# Patient Record
Sex: Female | Born: 1935 | Race: White | Hispanic: No | Smoking: Never smoker
Health system: Southern US, Community
[De-identification: ages and names within clinical notes are randomized; demographics above are authoritative.]

## PROBLEM LIST (undated history)

## (undated) DIAGNOSIS — I469 Cardiac arrest, cause unspecified: Secondary | ICD-10-CM

## (undated) DIAGNOSIS — K219 Gastro-esophageal reflux disease without esophagitis: Secondary | ICD-10-CM

## (undated) DIAGNOSIS — I82409 Acute embolism and thrombosis of unspecified deep veins of unspecified lower extremity: Secondary | ICD-10-CM

## (undated) DIAGNOSIS — I4891 Unspecified atrial fibrillation: Secondary | ICD-10-CM

## (undated) DIAGNOSIS — M1990 Unspecified osteoarthritis, unspecified site: Secondary | ICD-10-CM

## (undated) DIAGNOSIS — D649 Anemia, unspecified: Secondary | ICD-10-CM

## (undated) DIAGNOSIS — J189 Pneumonia, unspecified organism: Secondary | ICD-10-CM

## (undated) DIAGNOSIS — I8390 Asymptomatic varicose veins of unspecified lower extremity: Secondary | ICD-10-CM

## (undated) DIAGNOSIS — E7800 Pure hypercholesterolemia, unspecified: Secondary | ICD-10-CM

## (undated) DIAGNOSIS — E119 Type 2 diabetes mellitus without complications: Secondary | ICD-10-CM

## (undated) DIAGNOSIS — I10 Essential (primary) hypertension: Secondary | ICD-10-CM

## (undated) DIAGNOSIS — R011 Cardiac murmur, unspecified: Secondary | ICD-10-CM

## (undated) DIAGNOSIS — I509 Heart failure, unspecified: Secondary | ICD-10-CM

## (undated) HISTORY — DX: Asymptomatic varicose veins of unspecified lower extremity: I83.90

## (undated) HISTORY — DX: Essential (primary) hypertension: I10

## (undated) HISTORY — DX: Anemia, unspecified: D64.9

## (undated) HISTORY — PX: ABDOMINAL HYSTERECTOMY: SHX81

## (undated) HISTORY — DX: Heart failure, unspecified: I50.9

## (undated) HISTORY — DX: Unspecified atrial fibrillation: I48.91

## (undated) HISTORY — DX: Unspecified osteoarthritis, unspecified site: M19.90

## (undated) HISTORY — PX: TUBAL LIGATION: SHX77

## (undated) HISTORY — PX: APPENDECTOMY: SHX54

## (undated) HISTORY — PX: BACK SURGERY: SHX140

---

## 1998-06-16 ENCOUNTER — Encounter: Payer: Self-pay | Admitting: Cardiology

## 1998-06-16 ENCOUNTER — Inpatient Hospital Stay (HOSPITAL_COMMUNITY): Admission: AD | Admit: 1998-06-16 | Discharge: 1998-06-17 | Payer: Self-pay | Admitting: Cardiology

## 1999-05-31 ENCOUNTER — Inpatient Hospital Stay (HOSPITAL_COMMUNITY): Admission: AD | Admit: 1999-05-31 | Discharge: 1999-06-02 | Payer: Self-pay | Admitting: Neurosurgery

## 1999-06-01 ENCOUNTER — Encounter: Payer: Self-pay | Admitting: Internal Medicine

## 2001-11-19 ENCOUNTER — Inpatient Hospital Stay (HOSPITAL_COMMUNITY): Admission: EM | Admit: 2001-11-19 | Discharge: 2001-11-22 | Payer: Self-pay | Admitting: Cardiovascular Disease

## 2006-06-28 ENCOUNTER — Emergency Department (HOSPITAL_COMMUNITY): Admission: EM | Admit: 2006-06-28 | Discharge: 2006-06-28 | Payer: Self-pay | Admitting: Emergency Medicine

## 2008-11-14 HISTORY — PX: POSTERIOR LAMINECTOMY / DECOMPRESSION LUMBAR SPINE: SUR740

## 2008-11-15 ENCOUNTER — Ambulatory Visit (HOSPITAL_COMMUNITY): Admission: RE | Admit: 2008-11-15 | Discharge: 2008-11-15 | Payer: Self-pay | Admitting: Neurosurgery

## 2008-11-19 ENCOUNTER — Inpatient Hospital Stay (HOSPITAL_COMMUNITY): Admission: RE | Admit: 2008-11-19 | Discharge: 2008-11-22 | Payer: Self-pay | Admitting: Neurosurgery

## 2010-05-07 ENCOUNTER — Encounter: Payer: Self-pay | Admitting: Unknown Physician Specialty

## 2010-07-23 LAB — BASIC METABOLIC PANEL
BUN: 8 mg/dL (ref 6–23)
CO2: 25 mEq/L (ref 19–32)
Calcium: 9.3 mg/dL (ref 8.4–10.5)
Creatinine, Ser: 0.65 mg/dL (ref 0.4–1.2)
Glucose, Bld: 130 mg/dL — ABNORMAL HIGH (ref 70–99)

## 2010-07-23 LAB — CBC
Hemoglobin: 12.5 g/dL (ref 12.0–15.0)
MCHC: 34 g/dL (ref 30.0–36.0)
Platelets: 306 10*3/uL (ref 150–400)
RDW: 14.3 % (ref 11.5–15.5)

## 2010-07-23 LAB — PROTIME-INR
INR: 0.9 (ref 0.00–1.49)
Prothrombin Time: 12.1 seconds (ref 11.6–15.2)

## 2010-08-29 NOTE — Op Note (Signed)
Alexandra Hogan, Alexandra Hogan                ACCOUNT NO.:  [id number]   MEDICAL RECORD NO.:  [id number]          PATIENT TYPE:  INP   LOCATION:  3004                         FACILITY:  [facility]   PHYSICIAN:  Hilda Lias, M.D.   DATE OF BIRTH:  01/23/1929   DATE OF PROCEDURE:  11/19/2008  DATE OF DISCHARGE:                               OPERATIVE REPORT   PREOPERATIVE DIAGNOSIS:  Lower degenerative disk disease with chronic  L4, 5, S1 radiculopathy, right.   POSTOPERATIVE DIAGNOSIS:  Lower degenerative disk disease with chronic  L4, 5, S1 radiculopathy, right.   PROCEDURE:  Right L3-4, L4-5, L5-S1 laminotomy and foraminotomy,  decompression of the thecal sac and nerve root, microscope.   SURGEON:  Hilda Lias, MD   ASSISTANT:  Clydene Fake, MD   CLINICAL HISTORY:  [name] is a 77 year old female who was complaining  of back pain with radiation to the right leg.  I have seen Mr. Mcgowen for  2 years, and she came to my office with her family stating she was  worse.  Myelogram shows stenosis mostly at the neural foramen at the  level of L3-4, L4-5, and L5-S1 right worse than the left one.  The  patient denies any pain in the left leg.  The patient wants to proceed  with surgery.   PROCEDURE:  The patient was taken to the OR, and after intubation, she  was positioned in a prone manner.  The back was cleaned with DuraPrep.  Midline incision from L3 to L5-S1 was made and muscles were retracted  bilaterally.  Then with the drill and with the help of the microscope,  we did a laminotomy at L3-4, L4-5, and L5-S1.  A thick calcified  ligament was also excised.  We went ahead and we decompressed the L3-L4  with plenty of room for the L3 and L4 nerve root.  At the L4-5 and L5-S1  the area was more narrow, but at the end we had a good decompression for  the L4-L5 and L5-S1 nerve root.  X-ray showed that indeed we were in the  right area.  Having good decompression, the area was irrigated.  Valsalva maneuver was negative.  Then fentanyl and Depo-Medrol were left  in the epidural space and the wound was closed with Vicryl and Steri-  Strips.           ______________________________  Hilda Lias, M.D.     EB/MEDQ  D:  11/19/2008  T:  11/20/2008  Job:  [id number]

## 2010-08-29 NOTE — Discharge Summary (Signed)
Alexandra Hogan, Alexandra Hogan                ACCOUNT NO.:  [id number]   MEDICAL RECORD NO.:  [id number]          PATIENT TYPE:  INP   LOCATION:  3004                         FACILITY:  [facility]   PHYSICIAN:  Hilda Lias, M.D.   DATE OF BIRTH:  1928/06/01   DATE OF ADMISSION:  11/19/2008  DATE OF DISCHARGE:  11/22/2008                               DISCHARGE SUMMARY   ADMISSION DIAGNOSIS:  Degenerative disk disease with a right L4, L5, and  S1 radiculopathy.   POSTOPERATIVE DIAGNOSIS:  Degenerative disk disease with a right L4, L5,  and S1 radiculopathy.   PROCEDURE:  The patient was admitted because of back pain with radiation  down to right leg.  She has failed conservative treatment.  X-ray showed  degenerative disk disease with a stenosis, right worse than the left  one.  Surgery was advised.  Laboratory normal.   COURSE IN THE HOSPITAL:  The patient was taken to surgery on August 6,  and a right L3-L4, L4-L5, and L5-S1 laminectomy, foraminotomy was done.  Today, she is doing great.  She is walking, she wants to go home.   CONDITION ON DISCHARGE:  Improving.   MEDICATIONS:  She will continue taking her previous medication including  Vicodin for pain.   DIET:  Regular.   ACTIVITY:  Not to drive for at least 10 days.   FOLLOWUP:  She will be seen by me in my office in 4 weeks or before as  needed.           ______________________________  Hilda Lias, M.D.     EB/MEDQ  D:  11/22/2008  T:  11/22/2008  Job:  [id number]

## 2010-09-01 NOTE — Consult Note (Signed)
Alexandra Hogan, Alexandra Hogan                          ACCOUNT NO.:  [id number]   MEDICAL RECORD NO.:  [id number]                   PATIENT TYPE:  INP   LOCATION:  3731                                 FACILITY:  [facility]   PHYSICIAN:  Deanna Artis. Sharene Skeans, M.D.           DATE OF BIRTH:  Aug 13, 1928   DATE OF CONSULTATION:  DATE OF DISCHARGE:                              NEUROLOGY CONSULTATION   DATE OF BIRTH: 1929/01/12   CHIEF COMPLAINT:  Possible seizure.   HISTORY OF PRESENT ILLNESS:  Alexandra Hogan is a 77 year old woman transferred  from [facility] by Dr. Gilford Raid Harsh for a failed stress test during  which time she had a run of wide QRS tachycardia thought to represent sinus  with left bundle branch block and complained of palpations without chest  pain. The patient did not show signs of cardiac ischemia but plans were made  to have the patient evaluated at [facility] for revision of  previous ablation therapy for atrial flutter with rapid ventricular  response. The patient has had episodes of dull and heavy substernal chest  pain radiating down into her arms with associated diaphoresis and dizziness.  The pain lasts from 15 to 20 minutes at a time but this did not occur during  the hospitalization. The ablation therapy was apparently carried out in  February 2001 at [facility] but the patient had occasional episode  of fast and regular heart beat in the aftermath and was kept on Coumadin. I  was asked to see the patient because she had a syncopal episode today  followed by a brief seizure event. This happened some time between 3:00 and  3:30 p.m. Reviewing the rhythm strip around 3:10 p.m., the patient had a  heart rate of 43 with, what appears to be, a junctional rhythm. According to  witnesses, the patient was sitting in a chair and stood up to answer her  phone and talked with her phone. During that time, she became dizzy and felt  as if she was going to  faint. She sat down in the chair and appeared to be  chalky according to her companion. The staff went to get a wheelchair to  transfer her to a private room. The patient slumped back in the chair and  began shaking. When he returned, the patient was groggy, however, she had  not lost control of bowels or bladder nor had she bitten her tongue. She was  assisted into the bed which is the first think that she remembers after  talking on the  phone. The patient was evaluated by the Cardiology Service  shortly thereafter. The patient showed mild orthostatic changes and then  recovered. As a result of her episode, I was asked to see the patient to  evaluate for possible seizure disorder. The only seizure-like event that the  patient can remember is back to back seizures that occurred  in 1960 when  she was having surgery to tack up her kidney. I am not exactly sure what  took place in this operation but she was having problems with urinary tract  infections before the procedure and did not afterwards.   PAST MEDICAL HISTORY:  Unremarkable for hypertension, diabetes mellitus,  TIA's and cerebral accidents.   PAST SURGICAL HISTORY:  Remarkable for partial hysterectomy in the 1970s,  tonsillectomy, adenoidectomy, bilateral iridectomies with lens implantation,  as well as the kidney procedure.   REVIEW OF SYSTEMS:  The patient has not had headaches, fever, chills, cough,  abdominal pain. The patient says that she has had some nausea associated  with her dizzy spells but did not complain of nausea today. She has had no  vomiting or diarrhea or skin rashes. No thyroid dysfunction. The remainder  of the twelve system review was negative.   CURRENT MEDICATIONS:  Lanoxin 0.25 mg per day, Lopressor 25 mg bid, Coumadin  1.5 mg QD, Estrogen one daily, Triavil one QHS.   ALLERGIES:  No known drug allergies.   FAMILY HISTORY:  There is a very significant family history of heart disease  on her father's  side with the youngest female dying in his 31s.   SOCIAL HISTORY:  The patient has had a friend for forty one years. She  does not smoke cigarettes or drink alcohol.   PHYSICAL EXAMINATION:  GENERAL: A pleasant, well developed, well nourished  right handed woman in no acute distress.  VITAL SIGNS: Blood pressure 140/84, resting pulse 62, temperature 97.3, and  pulse oximetry 99% on 2 liters of oxygen. She is now not wearing the oxygen.  HEENT: No signs of infection.  NECK: Supple. Full range of motion. No cranial or cervical bruits.  LUNGS: Clear to auscultation and percussion.  HEART: No murmur, rub, or gallop. Pulses normal.  ABDOMEN: Soft and nontender. Bowel sounds normal.  EXTREMITIES: Well formed without edema, cyanosis, or alterations in tone or  tight heel cords.  NEURO: Mental status revealed that the patient was awake, alert,  appropriate. No dysphagia or dystaxia. She had normal fund of knowledge.  Normal recent and remote memory. Round and reactive pupils. Normal fundi.  Full visual fields and normal simultaneous stimuli. The patient has had  bilateral iridectomies. Extraocular movements are full and conjugate. Okay  end responses equal bilaterally. Symmetric facial strength and sensation.  Air conduction greater than bone conduction. The patient has no protrusion  of tongue and elevated uvula in midline. Motor exam reveals normal strength,  tone, and mass. Good fine motor movements. No pronator drift. Sensation  intact. Cerebellar exam revealed good finger-to-nose without repetitive  movements. Gait and station was normal. She could walk on her heels and  toes. She performed some tandem with difficulty. Romberg response was a it  shaky but she did not fell. Deep tendon reflexes were symmetric, normal at  the knees, and diminished elsewhere. The patient had bilateral flexor  plantar responses.  IMPRESSION:  Syncope (780.2) with a syncopal seizure (780.39). I strongly   suspect that the patient had a vaso vagal event based on the rhythm strip.  If she had had a seizure, she would have had sinus tachycardia, which was  never present during the period that she was evaluated. Nonetheless, with  the past history of back-to-back seizures, we should evaluate her seizures  with an EEG.   PLAN:  1. EEG in the morning.  2. Continue ablation therapy on November 21, 2001.  3. If episodes continue, we may need to treat the patient for     neurocardiogenic syncope.  4. CT scan is not necessary. Apparently, the patient refused the scan     previously.  5. With a normal examination, I see no reason to carry out imaging studies.   SUMMARY:  I have discussed the above with the patient and she understands.  If you have any questions about this or if I can be of assistance, do not  hesitate to contact me.                                               Deanna Artis. Sharene Skeans, M.D.   [facility]  D:  11/19/2001  T:  11/24/2001  Job:  [id number]   cc:   Noralyn Pick. Eden Emms, M.D. [facility]

## 2010-09-01 NOTE — Discharge Summary (Signed)
[name], Hogan                          ACCOUNT NO.:  [id number]   MEDICAL RECORD NO.:  [id number]                   PATIENT TYPE:  INP   LOCATION:  3731                                 FACILITY:  [facility]   PHYSICIAN:  Joellyn Rued                        DATE OF BIRTH:  03-Jan-1929   DATE OF ADMISSION:  11/19/2001  DATE OF DISCHARGE:  11/22/2001                           DISCHARGE SUMMARY - REFERRING   SUMMARY OF HISTORY:  The patient is a 77 year old white female patient of  Dr. Doreen Beam.  She was admitted to [facility] on November 17, 2001 with  intermittent episodes of dull chest discomfort with radiation down to both  arms.  This was associated with dizziness, diaphoresis and palpitations.  Her discomfort lasted 15-20 minutes at a time and then would resolve and not  necessarily related to activity.  While in the hospital she had recurring  chest discomfort and a stress Cardiolite showed an EF of 67% and an exercise  induced left bundle branch block.  She is status post A flutter ablation by  Dr. Ladona Ridgel and Dr. Phoebe Perch wanted her transferred for cardiac catheterization  to rule out coronary artery disease.  Her INR on August 6th was 2.4 thus her  Coumadin was held and she was scheduled for cardiac catheterization when her  INR is less than 1.5.   PAST MEDICAL HISTORY:  Her history is also notable for tonsillectomy,  partial hysterectomy, BLA, cataract removal, and dizziness.   LABORATORY DATA:  Here at [facility] on admission H&H is 11.7 and  34.8, normal indices, platelets 288, WBC 10.3.  PT on admission was 23.2,  INR 2.4.  On the 7th it was 22.2 and INR 2.2.  Sodium was 137, potassium  4.2, BUN 12, creatinine 0.8, glucose 120, calcium 8.7.  Subsequent  hematologies were essentially unremarkable.  By the 8th her INR was down to  1.5.  Chest x-ray from [place] did not show any acute abnormality.  EKG  showed normal sinus rhythm, delayed R wave, left axis  deviation, left  anterior hemiblock, no acute changes.  Nuclear imaging from [facility] showed an EF of 65%, no scar or ischemia.   HOSPITAL COURSE:  The patient was admitted to 3700 in anticipation of  cardiac catheterization when her INR decreased.  Shortly after admission  were called to evaluate the patient for possible syncope.  The patient  stated while sitting in a chair she stood up to answer the phone and while  standing and talking on the phone she became dizzy and felt like she was  going to faint.  She sat back down in a chair and a friend said she looked  chalky.  The staff was anticipating transferring her to a private room and  went to get a wheelchair, however, the patient had never told her she  was  dizzy.  The friend stated after the staff member left that she fell back  into the chair and began shaking.  He left the room to call the RN and when  he returned she was groggy but not shaking any more.  During this episode  she did not have any bowel movement or urinary incontinence, and the patient  stated that she felt fine.  Telemetry review did not show any abnormalities.  She describes a history of orthostatic dizziness which usually resolves in  a few minutes.  She is unsure of the frequency.  Orthostatics were checked  and were not overwhelmingly significant.   She was discussed with Dr. Eden Emms.  It was felt a CT was in order, however,  patient refused because of claustrophobia.  Dr. Eden Emms also ordered a neuro  consult.  This was obtained on 02/30/03 by Dr. Sharene Skeans.  According to his  progress note, her neuro exam was unremarkable and felt that this may be  vasovagal syncopal seizure episode.  He was concerned about possible  telemetry abnormalities but again we reviewed this and there was no  significant event.  She did not have any further episodes while in the  hospital.   On 08/07 her INR was still elevated at 2.2 thus she received 1 mg of vitamin  K.   By 08/08 her INR was down to 1.5 and Dr. Eden Emms performed a cardiac  catheterization.  This showed normal coronaries with a normal LV function  with an EF of 65%.  LV pressure was 153/15, aortic 153/74.  An EEG was  performed by neurology and this was negative for any seizure activity.  _________  on bed rest.  She was ambulating without difficulty and by 08/09  it was felt that she could be discharged home from a cardiac standpoint.  The patient was very concerned about these spells that she is having  because she did not know the etiology.  It was explained to her that we had  not discovered any cardiac etiology or neurology etiology for these episodes  that she is having and that further workup could be done as an outpatient.  Also prior to discharge she stated that she needed a copy of the  catheterization report to submit to her insurance company.  It was explained  to her that the official report has not been dictated yet, that she can  receive a copy from Dr. Phoebe Perch when it becomes available.  However, we did  give her a copy of the progress notes written by Dr. Eden Emms in regards to  her cardiac catheterization.   DISCHARGE DIAGNOSIS:  Noncardiac chest discomfort and near syncope, unknown  etiology.  History as previously.   DISPOSITION:  She was asked to continue on her home medications.  There is a  discrepancy between the recorded medications on her history and physical and  supposedly what she takes at home, however, she does not have a current list  of her medications at home.  She was asked to continue these medications.  She believes she is taking Lanoxin 0.25 mg q.d., Lopressor 25 mg b.i.d.,  Coumadin 1 mg q.d., estrogen 1.25 mg q.d., Triavil 2/25 mg q.h.s., and  Lipitor 20 mg q.d.  She is advised no lifting, driving, sexual activity or  heavy exertion for two days.  Maintain a low salt, fat and cholesterol diet. If she has any problems with the cath site she was asked to  call Dr. Phoebe Perch.  She will have a PT INR drawn in one week with Dr. Phoebe Perch and she was asked  to call him on Monday to arrange a followup appointment.  Dr. Eden Emms noted  she be continued on Coumadin long term.  The patient has several questions  as to why she is taking all these medications and it was explained to her  that Dr. Phoebe Perch and her medical doctor, Dr. Yetta Flock, that she should could  bring these issues up to them for possible adjustments.                                               Joellyn Rued    EW/MEDQ  D:  11/22/2001  T:  11/26/2001  Job:  [id number]   cc:   Clydene Fake, M.D.   Dr. Yetta Flock

## 2010-09-01 NOTE — Op Note (Signed)
[place]  Patient:    Alexandra Hogan, Alexandra Hogan                       MRN: [id number] Proc. Date: 06/01/99 Adm. Date:  [id number] Attending:  Nathen May CC:         Florence Canner, M.D., [place], [place]             Karlton Lemon, [facility]                           Operative Report  PROCEDURE PERFORMED:  Electrophysiology study and RF catheter ablation of atrial flutter.  REFERRING PHYSICIAN:  Florence Canner, M.D.; [place], [place].  INTRODUCTION:  The patient is a 77 year old woman with a history of recurrent syncopal episodes in the past.  She had a recent syncopal episode several days go, and the paramedics were called.  She was found to be in atrial flutter with a rapid ventricular rate.  She was admitted to the hospital where she was begun on Sotalol and her atrial flutter had previously spontaneously terminated.  Unfortunately he became bradycardic on Sotalol and rather than proceed with pacemaker implantation she was referred for electrophysiology study and catheter ablation.  DESCRIPTION OF PROCEDURE:  After informed consent was obtained, the patient was  taken to the diagnostic EP lab in the fasting state.  After usual preparation and draping, intravenous fentanyl and midazolam were given for conscious sedation. A 39f hexapolar catheter was inserted percutaneously into the right jugular vein and advanced to the coronary sinus.  A 5-French quadripolar catheter was inserted percutaneously into the right femoral vein and advanced to the His bundle region. A 7-French 20-pole halo catheter was inserted percutaneously into the right femoral vein and advanced to the right atrium.  Rapid atrial pacing and programmed atrial stimulation were then carried out from the coronary sinus.  This resulted in an AV Wenckebach cycle length of 320 ms. The ERP of the AV node was 500/200.  Following this, rapid atrial  pacing was carried out from the coronary sinus with a pacing cycle length of 290 ms and stepwise increased down to 200 ms.  The patient spontaneously developed typical atrial flutter, which would terminate again spontaneously.  Rapid atrial pacing was then carried out from the high right atrium.  This resulted in the initiation of typical counterclockwise atrial flutter.  Mapping of the atrial flutter demonstrated the atrial activation to occur in a counterclockwise fashion around the tricuspid valve annulus, and the coronary sinus activation was proximal to distal.  Pacing from the atrial flutter isthmus was then carried from the ablation catheter, and this demonstrated classic concealed entrainment at an atrial pacing rate of 210 ms.  Following this, the ablation catheter was moved to the tricuspid valve annulus, and RF energy application was delivered between the tricuspid valve annulus and the  station ridge.  The patient, on the first RF energy application, had spontaneous termination of his atrial flutter and restoration of sinus rhythm.  Rapid atrial pacing from the coronary sinus was then carried out, which demonstrated persistent isthmus conduction.  A total of three RF energy applications were then delivered, which resulted in the creation of block in the atrial flutter isthmus when pacing from the coronary sinus.  Six additional bonus RF energy applications were delivered, for a total  of 10 RF energy applications.  The patient was then observed for 30 minutes following the creation of block in the atrial flutter isthmus.  At this point, no additional evidence of atrial flutter isthmus conduction. The catheters were removed.  Hemostasis was assured.  The patient was returned to her room in good condition.  COMPLICATIONS:  None.  RESULTS: 1. Baseline Introitus View:  The baseline introitus view demonstrates normal sinus    rhythm, with the sinus node cycle  length of 823 ms.  The PR interval was 190 ms.    The QRS duration was 75 ms.  The AH interval had a 106 ms.  The HV interval as    69 ms. 2. Rapid Atrial Pacing:  Demonstrated an AV Wenckebach cycle length of 320 ms. 3. Programmed Atrial Stimulation:  Demonstrated AV node ERP of 500/210.  During    programmed atrial stimulation there was no AH jump and no evidence of dual AV    node physiology. 4. Arrhythmia is observed. 5. Atrial Flutter Initiation:  Rapid atrial pacing from the high right atrium.  Duration is the same.  Termination RF energy application. 6. Mapping:  Mapping of the patients atrial flutter demonstrated typical    counterclockwise tricuspid annular reentry. 7. RF Energy Application:  A total of 10 RF energy applications were delivered o    the atrial flutter isthmus.  This resulted in termination of atrial    flutter, restoration of sinus rhythm, and creation of bidirectional block    in the atrial flutter isthmus.  CONCLUSIONS:  This demonstrates successful RF catheter ablation of typical, inducible atrial flutter; without any immediate procedure complications.DD: 06/01/99 TD:  06/01/99 Job: [id number]

## 2010-09-01 NOTE — Cardiovascular Report (Signed)
   Alexandra Hogan, Alexandra Hogan                          ACCOUNT NO.:  [id number]   MEDICAL RECORD NO.:  [id number]                   PATIENT TYPE:  INP   LOCATION:  3731                                 FACILITY:  [facility]   PHYSICIAN:  Noralyn Pick. Eden Emms, M.D. [facility]           DATE OF BIRTH:  31-Jul-1928   DATE OF PROCEDURE:  DATE OF DISCHARGE:  11/22/2001                              CARDIAC CATHETERIZATION   INDICATION:  Recurrent chest pain with presyncope. The patient was  transferred to [facility] 48 hours ago. We had to wait for her INR  to drop below 2 to proceed with her catheterization. She has had a previous  flutter ablation, which is the reason she was on Coumadin.   DESCRIPTION OF PROCEDURE:  Standard catheterization was done from the right  coronary artery.   RESULTS:  Left main coronary artery was normal.   Left anterior descending artery was normal.   The circumflex coronary artery was normal.   The right coronary artery was small and normal.  There was some catheter-tip  spasm.   RIGHT ANTERIOR OBLIQUE VENTRICULOGRAPHY:  RAO ventriculography revealed  hypodynamic LV function. Ejection fraction was in excess of 80%.  There was  gradient across the aortic valve and no MR.  Aortic pressure was 158/72, LV  pressure is 158/15.   IMPRESSION/PLAN:  The patient's chest pain is noncardiac in etiology. She  will be discharged in the morning so long as her groin heals well. I think  she can be recoumadinized without heparin overlap. Her flutter ablation was  two or three years ago and she has been maintaining sinus rhythm and does  not have other indications for Coumadin. The patient will follow up in  [place] with her primary MD and Dr. Sylvie Farrier.                                                       Noralyn Pick. Eden Emms, M.D. [facility]    PCN/MEDQ  D:  11/21/2001  T:  11/25/2001  Job:  [id number]   cc:   Gilford Raid Harsh

## 2010-09-01 NOTE — H&P (Signed)
Alexandra Hogan, PAYER                          ACCOUNT NO.:  [id number]   MEDICAL RECORD NO.:  [id number]                   PATIENT TYPE:  INP   LOCATION:  3731                                 FACILITY:  [facility]   PHYSICIAN:  Noralyn Pick. Eden Emms, M.D. [facility]           DATE OF BIRTH:  02-Dec-1928   DATE OF ADMISSION:  11/19/2001  DATE OF DISCHARGE:                                HISTORY & PHYSICAL   HISTORY OF PRESENT ILLNESS:  The patient is a 77 year old patient of Dr.  Gilford Raid Harsh.  She was admitted to [facility] on November 17, 2001.  She  was admitted with intermittent episodes of dull substernal chest discomfort  with radiation down both arms.  This was associated with some dizziness and  diaphoresis and palpitations.  Pain lasted 15 to 20 minutes at a time and  then resolved.  It was not necessarily related to activity.  She had  recurrent pain while in the hospital.  The patient had an exercise stress  test by Dr. Sylvie Farrier.  The Cardiolite study was normal with EF of 67%, where  the patient developed an exercise-induced left bundle branch block.  She is  status post previous flutter ablation by Dr. Doylene Canning. Ladona Ridgel and was on  digoxin at 0.25 mg at the time.  Dr. Sylvie Farrier wanted her transferred to [facility] for heart catheterization to definitively rule out coronary  disease.  Her INR today on November 19, 2001 was 2.4 and her catheterization  will be scheduled for Friday.   PAST MEDICAL HISTORY:  The patient's past medical history is remarkable for  the history of a flutter ablation which was I believe was done in February  of 2001.  She has been on Coumadin since this time.  She does not have  hypertension, diabetes, previous TIA or CVA.  She had a hysterectomy in the  1970s.  She is a nonsmoker and nondrinker.   FAMILY HISTORY:  Several males on her father's side have died of heart  disease, the youngest being in their 23s.   ALLERGIES:  She has no known drug allergies.   CURRENT  MEDICATIONS:  1. Lanoxin 0.25 mg a day.  2. Lopressor 25 mg b.i.d.  3. Coumadin 1.5 mg a day.  4. Estrogen 1.25 mg a day.  5. Triavil 2/25 mg q.h.s.   REVIEW OF SYSTEMS:  She denies any headaches, seizure disorder, fever,  chills, cough, sputum production, shortness of breath, recent abdominal  pain, change in bowel habits or skin rashes.   PHYSICAL EXAMINATION:  GENERAL:  She is in no distress.  VITAL SIGNS:  The blood pressure is 130/70.  Pulse is 70 and regular.  LUNGS:  Clear.  NECK:  Carotids normal.  CARDIAC:  There is an S1 and S2 without murmur, rub, gallop or click.  ABDOMEN:  Benign.  _________.  EXTREMITIES:  Intact pulses.  No edema.  LABORATORY AND ACCESSORY CLINICAL DATA:  Her EKG shows no acute changes.   Her stress test did show exercise-induced left bundle branch block.   Her INR is 2.4.  Her creatinine is 0.8.  Her enzymes are negative x3.   IMPRESSION:  I am not sure if the patient will have any coronary artery  disease.   However, she has recurrent chest pain and an exercise-induced left bundle  branch block.  I suspect her left bundle branch block is secondary to some  conduction disease caused by the ablation, in addition to her Lanoxin and  beta blocker.  The patient's INR is 2.4.  We will hold her Coumadin and  start heparin and when her INR drops below 2, she be catheterized on Friday.  The risks and benefits of the procedure were discussed with the patient and  she is willing to proceed.                                               Noralyn Pick. Eden Emms, M.D. [facility]    PCN/MEDQ  D:  11/19/2001  T:  11/19/2001  Job:  [id number]

## 2012-02-18 ENCOUNTER — Other Ambulatory Visit: Payer: Self-pay | Admitting: Neurosurgery

## 2012-02-18 DIAGNOSIS — M5410 Radiculopathy, site unspecified: Secondary | ICD-10-CM

## 2012-02-18 DIAGNOSIS — M549 Dorsalgia, unspecified: Secondary | ICD-10-CM

## 2012-03-05 ENCOUNTER — Ambulatory Visit
Admission: RE | Admit: 2012-03-05 | Discharge: 2012-03-05 | Disposition: A | Discharge status: Home / Self Care | Payer: Medicare Other | Source: Ambulatory Visit | Attending: Neurosurgery | Admitting: Neurosurgery

## 2012-03-05 VITALS — BP 175/66 | HR 61 | Ht 61.0 in | Wt 147.0 lb

## 2012-03-05 DIAGNOSIS — M5410 Radiculopathy, site unspecified: Secondary | ICD-10-CM

## 2012-03-05 DIAGNOSIS — M549 Dorsalgia, unspecified: Secondary | ICD-10-CM

## 2012-03-05 MED ORDER — DIAZEPAM 5 MG PO TABS
5.0000 mg | ORAL_TABLET | Freq: Once | ORAL | Status: AC
  Administered 2012-03-05: 5 mg via ORAL

## 2012-03-05 MED ORDER — IOHEXOL 180 MG/ML  SOLN
15.0000 mL | Freq: Once | INTRAMUSCULAR | Status: AC | PRN
  Administered 2012-03-05: 15 mL via INTRATHECAL

## 2012-03-05 MED ORDER — OXYCODONE-ACETAMINOPHEN 5-325 MG PO TABS
2.0000 | ORAL_TABLET | Freq: Once | ORAL | Status: AC
  Administered 2012-03-05: 2 via ORAL

## 2012-03-05 NOTE — Progress Notes (Signed)
Pt states she has been off of trazadone and lexapro for the past 2 days.  jkl

## 2012-03-05 NOTE — Progress Notes (Signed)
Alexandra Hogan at bedside of patient in nursing station after myelogram.  Patient medicated for pain; tolerating PO's well.  jkl

## 2012-04-02 ENCOUNTER — Other Ambulatory Visit: Payer: Self-pay | Admitting: Neurosurgery

## 2012-04-02 DIAGNOSIS — M5410 Radiculopathy, site unspecified: Secondary | ICD-10-CM

## 2012-04-02 DIAGNOSIS — M549 Dorsalgia, unspecified: Secondary | ICD-10-CM

## 2012-04-07 ENCOUNTER — Ambulatory Visit
Admission: RE | Admit: 2012-04-07 | Discharge: 2012-04-07 | Disposition: A | Discharge status: Home / Self Care | Payer: Medicare Other | Source: Ambulatory Visit | Attending: Neurosurgery | Admitting: Neurosurgery

## 2012-04-07 ENCOUNTER — Other Ambulatory Visit: Payer: Self-pay | Admitting: Neurosurgery

## 2012-04-07 VITALS — BP 157/77 | HR 64

## 2012-04-07 DIAGNOSIS — M5410 Radiculopathy, site unspecified: Secondary | ICD-10-CM

## 2012-04-07 DIAGNOSIS — M549 Dorsalgia, unspecified: Secondary | ICD-10-CM

## 2012-04-07 MED ORDER — IOHEXOL 180 MG/ML  SOLN
4.0000 mL | Freq: Once | INTRAMUSCULAR | Status: AC | PRN
  Administered 2012-04-07: 4 mL via INTRA_ARTICULAR

## 2012-04-07 MED ORDER — METHYLPREDNISOLONE ACETATE 40 MG/ML INJ SUSP (RADIOLOG
120.0000 mg | Freq: Once | INTRAMUSCULAR | Status: AC
  Administered 2012-04-07: 120 mg via INTRA_ARTICULAR

## 2012-07-28 ENCOUNTER — Other Ambulatory Visit: Payer: Self-pay | Admitting: *Deleted

## 2012-07-28 DIAGNOSIS — I83893 Varicose veins of bilateral lower extremities with other complications: Secondary | ICD-10-CM

## 2012-09-26 ENCOUNTER — Encounter: Payer: Medicare Other | Admitting: Vascular Surgery

## 2012-11-20 ENCOUNTER — Encounter: Payer: Self-pay | Admitting: Vascular Surgery

## 2012-11-21 ENCOUNTER — Ambulatory Visit (INDEPENDENT_AMBULATORY_CARE_PROVIDER_SITE_OTHER): Payer: Medicare Other | Admitting: Vascular Surgery

## 2012-11-21 ENCOUNTER — Encounter: Payer: Self-pay | Admitting: Vascular Surgery

## 2012-11-21 ENCOUNTER — Encounter (INDEPENDENT_AMBULATORY_CARE_PROVIDER_SITE_OTHER): Payer: Medicare Other | Admitting: Vascular Surgery

## 2012-11-21 VITALS — BP 177/67 | HR 56 | Ht 61.0 in | Wt 154.0 lb

## 2012-11-21 DIAGNOSIS — R609 Edema, unspecified: Secondary | ICD-10-CM

## 2012-11-21 DIAGNOSIS — I83893 Varicose veins of bilateral lower extremities with other complications: Secondary | ICD-10-CM | POA: Insufficient documentation

## 2012-11-21 DIAGNOSIS — I872 Venous insufficiency (chronic) (peripheral): Secondary | ICD-10-CM | POA: Insufficient documentation

## 2012-11-21 NOTE — Progress Notes (Signed)
[facility] [place]  Referred by:  Philemon Kingdom, MD [address] [place], [place] [address]  Reason for referral: Swollen B leg  History of Present Illness  Alexandra Hogan is a 79 y.o. (03-20-1929) female who presents with chief complaint: swollen B leg.  Patient notes, onset of swelling years ago, associated with no trigger.  The patient's symptoms include: swelling in both legs with standing, bursting sensation, and pain..  The patient has had no history of DVT, known history of pregnancy, known history of varicose vein, no history of venous stasis ulcers, no history of  Lymphedema and known history of skin changes in lower legs.  There is no family history of venous disorders.  The patient has used knee high compression stockings in the past.  Past Medical History  Diagnosis Date  . Atrial fibrillation   . CHF (congestive heart failure)   . Diabetes mellitus without complication     borderline  . Hypertension   . Arthritis   . Anemia     Past Surgical History  Procedure Laterality Date  . Back surgery  2011  . Abdominal hysterectomy    . Tubal ligation    . Appendectomy      History   Social History  . Marital Status: Divorced    Spouse Name: N/A    Number of Children: N/A  . Years of Education: N/A   Occupational History  . Not on file.   Social History Main Topics  . Smoking status: Never Smoker   . Smokeless tobacco: Never Used  . Alcohol Use: No  . Drug Use: No  . Sexually Active: Not on file   Other Topics Concern  . Not on file   Social History Narrative  . No narrative on file    Family History  Problem Relation Age of Onset  . Heart disease Mother   . Heart disease Father   . Cancer Brother    MEDS aspirin 81 MG tablet 81 mg, Daily  azelastine (ASTELIN) 137 MCG/SPRAY nasal spray 1 spray, As needed  Cholecalciferol (VITAMIN D3) 1000 UNITS CAPS 1 capsule, Daily  clotrimazole-betamethasone (LOTRISONE) lotion 1 application,  2 times daily  diazepam (VALIUM) 2 MG tablet 2 mg, Every 6 hours PRN  dicyclomine (BENTYL) 10 MG/5ML syrup 20 mg, 4 times daily PRN  escitalopram (LEXAPRO) 20 MG tablet 20 mg, Daily  esomeprazole (NEXIUM) 40 MG capsule 40 mg, Daily before breakfast estrogens, conjugated, (PREMARIN) 0.45 MG tablet 0.45 mg, Daily ferrous gluconate (FERGON) 325 MG tablet 325 mg, Daily with breakfast fluticasone (FLONASE) 50 MCG/ACT nasal spray 2 spray, Daily iohexol (OMNIPAQUE) 180 MG/ML injection 4 mL 4 mL, Once PRN isosorbide dinitrate (ISORDIL) 30 MG tablet 30 mg, Daily losartan (COZAAR) 25 MG tablet 25 mg, Daily methylPREDNISolone acetate (DEPO-MEDROL) injection (RADIOLOGY ONLY) 120 mg 120 mg, Once ondansetron (ZOFRAN) 8 MG tablet 8 mg, Every 8 hours PRN oxyCODONE (OXYCONTIN) 10 MG 12 hr tablet 10 mg, Every 6 hours PRN oxyCODONE-acetaminophen (PERCOCET) 7.5-325 MG per tablet 1 tablet, Every 6 hours PRN predniSONE (DELTASONE) 1 MG tablet 1 mg, Daily promethazine (PHENERGAN) 12.5 MG suppository 12.5 mg, Every 4 hours PRN spironolactone (ALDACTONE) 25 MG tablet 25 mg, Daily torsemide (DEMADEX) 100 MG tablet 100 mg, Daily traZODone (DESYREL) 50 MG tablet 50 mg, Daily at bedtime triamcinolone cream (KENALOG) 0.1 % 1 application, 2 times daily warfarin (COUMADIN) 1 MG tablet   Allergies  Allergen Reactions  . Codeine     Pt  states she is not real sure of the reaction but knows "it did not set well with her"   REVIEW OF SYSTEMS:  (Positives checked otherwise negative)  CARDIOVASCULAR:  []  chest pain, []  chest pressure, []  palpitations, []  shortness of breath when laying flat, [x]  shortness of breath with exertion,  [x]  pain in feet when walking, [x]  pain in feet when laying flat, []  history of blood clot in veins (DVT), []  history of phlebitis, [x]  swelling in legs, []  varicose veins  PULMONARY:  []  productive cough, []  asthma, []  wheezing  NEUROLOGIC:  []  weakness in arms or legs, []  numbness in arms or  legs, []  difficulty speaking or slurred speech, []  temporary loss of vision in one eye, []  dizziness  HEMATOLOGIC:  []  bleeding problems, []  problems with blood clotting too easily  MUSCULOSKEL:  []  joint pain, []  joint swelling  GASTROINTEST:  []  vomiting blood, []  blood in stool     GENITOURINARY:  []  burning with urination, []  blood in urine  PSYCHIATRIC:  []  history of major depression  INTEGUMENTARY:  []  rashes, []  ulcers  CONSTITUTIONAL:  []  fever, []  chills  Physical Examination Filed Vitals:   11/21/12 1458  BP: 177/67  Pulse: 56  Height: 5\' 1"  (1.549 m)  Weight: 154 lb (69.854 kg)  SpO2: 98%   Body mass index is 29.11 kg/(m^2).  General: A&O x 3, WDWN, obese  Head: [place]/AT  Ear/Nose/Throat: Hearing grossly intact, nares w/o erythema or drainage, oropharynx w/o Erythema/Exudate  Eyes: PERRLA, EOMI, Post surg chg to pupils   Neck: Supple, no nuchal rigidity, no palpable LAD  Pulmonary: Sym exp, good air movt, CTAB, no rales, rhonchi, & wheezing  Cardiac: RRR, Nl S1, S2, no Murmurs, rubs or gallops  Vascular: Vessel Right Left  Radial Palpable Palpable  Brachial  Palpable  Palpable  Carotid Palpable, without bruit Palpable, without bruit  Aorta Not palpable N/A  Femoral Palpable Palpable  Popliteal Not palpable Not palpable  PT Faintly Palpable Faintly Palpable  DP Not Palpable Not Palpable   Gastrointestinal: soft, NTND, -G/R, - HSM, - masses, - CVAT B  Musculoskeletal: M/S 5/5 throughout , Extremities without ischemic changes , BLE 2+ edema, varicosities B, spider veins B  Neurologic: CN 2-12 intact , Pain and light touch intact in extremities , Motor exam as listed above  Psychiatric: Judgment intact, Mood & affect appropriate for pt's clinical situation  Dermatologic: See M/S exam for extremity exam, no rashes otherwise noted  Lymph : No Cervical, Axillary, or Inguinal lymphadenopathy   Non-Invasive Vascular Imaging  BLE Venous Insufficiency  Duplex (Date: 11/21/2012):   RLE: no DVT and SVT, + SSV & GSV reflux, + deep venous reflux: CFV  LLE: no DVT and SVT, - GSV reflux, - deep venous reflux  Outside Studies/Documentation 5 pages of outside documents were reviewed including: outpatient clinic chart.  Medical Decision Making  Alexandra Hogan is a 79 y.o. female who presents with: RLE chronic venous insufficiency (C4), LLE CVI (C2)   Based on the patient's history and examination, I recommend: B compressive therapy.  I am surprised that the L leg demonstrates no significant reflux given that she has more swelling in that leg today and varicosities are evident.  There are no signs of lymphedema in that L but that might account for some swelling in that leg  I discussed with the patient the use of her 20-30 mm thigh high compression stockings and need for 3 month trial of such.  The  patient will follow up in 3 months with my partners in the [facility] for evaluation for: EVLA R GSV.  Thank you for allowing [place] to participate in this patient's care.  Leonides Sake, MD [facility] [place] Office: [phone] Pager: [phone]  11/21/2012, 3:36 PM

## 2013-02-27 ENCOUNTER — Ambulatory Visit: Payer: Medicare Other | Admitting: Vascular Surgery

## 2013-04-02 ENCOUNTER — Ambulatory Visit (INDEPENDENT_AMBULATORY_CARE_PROVIDER_SITE_OTHER): Payer: Medicare Other

## 2013-04-02 VITALS — BP 160/70 | HR 74 | Resp 18

## 2013-04-02 DIAGNOSIS — E1149 Type 2 diabetes mellitus with other diabetic neurological complication: Secondary | ICD-10-CM

## 2013-04-02 DIAGNOSIS — L608 Other nail disorders: Secondary | ICD-10-CM

## 2013-04-02 DIAGNOSIS — Q828 Other specified congenital malformations of skin: Secondary | ICD-10-CM

## 2013-04-02 DIAGNOSIS — E1140 Type 2 diabetes mellitus with diabetic neuropathy, unspecified: Secondary | ICD-10-CM

## 2013-04-02 DIAGNOSIS — E1142 Type 2 diabetes mellitus with diabetic polyneuropathy: Secondary | ICD-10-CM

## 2013-04-02 NOTE — Progress Notes (Signed)
   Subjective:    Patient ID: Alexandra Hogan, female    DOB: [birth year], 79 y.o.   MRN: [id number]  HPI trim my nails and trim calluses and I just had a pacemaker put in on Monday 03/30/13 Patient had a pacemaker placed on Monday of this week. She is doing much better with her heart although still somewhat weak. Continues to have keratoses sub-fifth MP. Bilateral as well as nails and debridement.   Review of Systems  Constitutional: Negative.   HENT: Negative.   Cardiovascular: Positive for palpitations and leg swelling.  Genitourinary: Negative.   Allergic/Immunologic: Negative.   Neurological: Positive for numbness.  Hematological: Negative.   Psychiatric/Behavioral: Negative.    no new changes other than recent pacemaker placement.     Objective:   Physical Exam Neurovascular status is intact as follows DP plus one over 4 bilateral PT plus one over 4 bilateral as well. There is some burning and abnormal sensation paresthesias both feet with decreased epicritic sensation bilateral. Patient has a history of fibromyalgia causing chronic pain in the feet and legs as well. Patient is significant plus one +2 edema right leg more so than left extending from the leg of the foot and ankle. Refill time 3 seconds all digits temperature warm turgor normal hair growth absent. Nails criptotic incurvated and brittle and discolored x10. Also has keratoses nucleated punctate keratotic lesion sub-fifth MTP area bilateral tender and draped lateral compression. Remainder of exam unremarkable rectus foot type flexible digital contractures noted       Assessment & Plan:  Assessment diabetes with complications clean peripheral neuropathy and possibly angiopathy. Nails thick brittle friable criptotic incurvated debridement x10 at this time. Also debridement multiple keratoses sub-fifth MTP area bilateral. Maintain accommodative cushion shoes at all times recommend followup in 3 months for continued palliative  care  Alvan Dame DPM

## 2013-04-02 NOTE — Patient Instructions (Signed)
Diabetes and Foot Care Diabetes may cause you to have problems because of poor blood supply (circulation) to your feet and legs. This may cause the skin on your feet to become thinner, break easier, and heal more slowly. Your skin may become dry, and the skin may peel and crack. You may also have nerve damage in your legs and feet causing decreased feeling in them. You may not notice minor injuries to your feet that could lead to infections or more serious problems. Taking care of your feet is one of the most important things you can do for yourself.  HOME CARE INSTRUCTIONS  Wear shoes at all times, even in the house. Do not go barefoot. Bare feet are easily injured.  Check your feet daily for blisters, cuts, and redness. If you cannot see the bottom of your feet, use a mirror or ask someone for help.  Wash your feet with warm water (do not use hot water) and mild soap. Then pat your feet and the areas between your toes until they are completely dry. Do not soak your feet as this can dry your skin.  Apply a moisturizing lotion or petroleum jelly (that does not contain alcohol and is unscented) to the skin on your feet and to dry, brittle toenails. Do not apply lotion between your toes.  Trim your toenails straight across. Do not dig under them or around the cuticle. File the edges of your nails with an emery board or nail file.  Do not cut corns or calluses or try to remove them with medicine.  Wear clean socks or stockings every day. Make sure they are not too tight. Do not wear knee-high stockings since they may decrease blood flow to your legs.  Wear shoes that fit properly and have enough cushioning. To break in new shoes, wear them for just a few hours a day. This prevents you from injuring your feet. Always look in your shoes before you put them on to be sure there are no objects inside.  Do not cross your legs. This may decrease the blood flow to your feet.  If you find a minor scrape,  cut, or break in the skin on your feet, keep it and the skin around it clean and dry. These areas may be cleansed with mild soap and water. Do not cleanse the area with peroxide, alcohol, or iodine.  When you remove an adhesive bandage, be sure not to damage the skin around it.  If you have a wound, look at it several times a day to make sure it is healing.  Do not use heating pads or hot water bottles. They may burn your skin. If you have lost feeling in your feet or legs, you may not know it is happening until it is too late.  Make sure your health care provider performs a complete foot exam at least annually or more often if you have foot problems. Report any cuts, sores, or bruises to your health care provider immediately. SEEK MEDICAL CARE IF:   You have an injury that is not healing.  You have cuts or breaks in the skin.  You have an ingrown nail.  You notice redness on your legs or feet.  You feel burning or tingling in your legs or feet.  You have pain or cramps in your legs and feet.  Your legs or feet are numb.  Your feet always feel cold. SEEK IMMEDIATE MEDICAL CARE IF:   There is increasing redness,   swelling, or pain in or around a wound.  There is a red line that goes up your leg.  Pus is coming from a wound.  You develop a fever or as directed by your health care provider.  You notice a bad smell coming from an ulcer or wound. Document Released: 03/30/2000 Document Revised: 12/03/2012 Document Reviewed: 09/09/2012 [employer] Patient Information 2014 [employer].  

## 2013-04-23 ENCOUNTER — Encounter: Payer: Self-pay | Admitting: Vascular Surgery

## 2013-04-24 ENCOUNTER — Ambulatory Visit (INDEPENDENT_AMBULATORY_CARE_PROVIDER_SITE_OTHER): Payer: Medicare Other | Admitting: Vascular Surgery

## 2013-04-24 ENCOUNTER — Encounter: Payer: Self-pay | Admitting: Vascular Surgery

## 2013-04-24 VITALS — BP 145/70 | HR 70 | Resp 16 | Ht 60.0 in | Wt 158.0 lb

## 2013-04-24 DIAGNOSIS — I83893 Varicose veins of bilateral lower extremities with other complications: Secondary | ICD-10-CM

## 2013-04-24 NOTE — Progress Notes (Signed)
Subjective:     Patient ID: Alexandra Hogan, female   DOB: 06/28/1928, 80 y.o.   MRN: [id number]  HPI this 80 year old female returns for evaluation of bilateral venous insufficiency. She was seen by Dr. Johny Drillinghan in August of 2014. She was found to have some reflux in the right great saphenous system and small saphenous system and was thought to possibly be a candidate for laser ablation. Patient has bilateral lower extremity edema worse over the past few years. She does occasionally wear shortly elastic compression stockings but does not effectively elevate her legs. She has no history of DVT, thrombophlebitis, stasis ulcers, or bleeding.  Past Medical History  Diagnosis Date  . Atrial fibrillation   . CHF (congestive heart failure)   . Diabetes mellitus without complication     borderline  . Hypertension   . Arthritis   . Anemia   . Varicose veins     History  Substance Use Topics  . Smoking status: Never Smoker   . Smokeless tobacco: Never Used  . Alcohol Use: No    Family History  Problem Relation Age of Onset  . Heart disease Mother   . Heart disease Father   . Cancer Brother     Allergies  Allergen Reactions  . Codeine     Pt states she is not real sure of the reaction but knows "it did not set well with her"    Current outpatient prescriptions:aspirin 81 MG tablet, Take 81 mg by mouth daily., Disp: , Rfl: ;  azelastine (ASTELIN) 137 MCG/SPRAY nasal spray, Place 1 spray into the nose as needed for rhinitis. Use in each nostril as directed, Disp: , Rfl: ;  Cholecalciferol (VITAMIN D3) 1000 UNITS CAPS, Take 1 capsule by mouth daily., Disp: , Rfl:  clotrimazole-betamethasone (LOTRISONE) lotion, Apply 1 application topically 2 (two) times daily., Disp: , Rfl: ;  diazepam (VALIUM) 2 MG tablet, Take 2 mg by mouth every 6 (six) hours as needed for anxiety., Disp: , Rfl: ;  dicyclomine (BENTYL) 10 MG/5ML syrup, Take 20 mg by mouth 4 (four) times daily as needed., Disp: , Rfl: ;   escitalopram (LEXAPRO) 20 MG tablet, Take 20 mg by mouth daily., Disp: , Rfl:  esomeprazole (NEXIUM) 40 MG capsule, Take 40 mg by mouth daily before breakfast., Disp: , Rfl: ;  ferrous gluconate (FERGON) 325 MG tablet, Take 325 mg by mouth daily with breakfast., Disp: , Rfl: ;  fluticasone (FLONASE) 50 MCG/ACT nasal spray, Place 2 sprays into the nose daily., Disp: , Rfl: ;  hydrALAZINE (APRESOLINE) 25 MG tablet, Take 25 mg by mouth 3 (three) times daily., Disp: , Rfl:  isosorbide dinitrate (ISORDIL) 30 MG tablet, Take 30 mg by mouth daily., Disp: , Rfl: ;  ondansetron (ZOFRAN) 8 MG tablet, Take 8 mg by mouth every 8 (eight) hours as needed for nausea., Disp: , Rfl: ;  oxyCODONE (OXYCONTIN) 10 MG 12 hr tablet, Take 10 mg by mouth every 6 (six) hours as needed for pain., Disp: , Rfl:  oxyCODONE-acetaminophen (PERCOCET) 7.5-325 MG per tablet, Take 1 tablet by mouth every 6 (six) hours as needed for pain., Disp: , Rfl: ;  predniSONE (DELTASONE) 1 MG tablet, Take 1 mg by mouth daily., Disp: , Rfl: ;  promethazine (PHENERGAN) 12.5 MG suppository, Place 12.5 mg rectally every 4 (four) hours as needed for nausea., Disp: , Rfl: ;  spironolactone (ALDACTONE) 25 MG tablet, Take 25 mg by mouth daily., Disp: , Rfl:  torsemide (DEMADEX) 100 MG  tablet, Take 100 mg by mouth daily. Take 1/2 tablet bid, Disp: , Rfl: ;  traZODone (DESYREL) 50 MG tablet, Take 50 mg by mouth at bedtime., Disp: , Rfl: ;  triamcinolone cream (KENALOG) 0.1 %, Apply 1 application topically 2 (two) times daily., Disp: , Rfl: ;  warfarin (COUMADIN) 1 MG tablet, Take 1 mg by mouth as directed., Disp: , Rfl:  estrogens, conjugated, (PREMARIN) 0.45 MG tablet, Take 0.45 mg by mouth daily. Take daily for 21 days then do not take for 7 days., Disp: , Rfl: ;  losartan (COZAAR) 25 MG tablet, Take 25 mg by mouth daily., Disp: , Rfl:   BP 145/70  Pulse 70  Resp 16  Ht 5' (1.524 m)  Wt 158 lb (71.668 kg)  BMI 30.86 kg/m2  Body mass index is 30.86  kg/(m^2).        .j   Review of Systems denies chest pain, dyspnea on exertion, PND, orthopnea, hemoptysis     Objective:   Physical Exam BP 145/70  Pulse 70  Resp 16  Ht 5' (1.524 m)  Wt 158 lb (71.668 kg)  BMI 30.86 kg/m2  Gen.-alert and oriented x3 in no apparent distress HEENT normal for age Lungs no rhonchi or wheezing Cardiovascular regular rhythm no murmurs carotid pulses 3+ palpable no bruits audible Abdomen soft nontender no palpable masses Musculoskeletal free of  major deformities Skin clear -no rashes Neurologic normal Lower extremities 3+ femoral and dorsalis pedis pulses palpable bilaterally with 1-2+ edema bilaterally lower thigh to foot-a few scattered varicosities in the left leg. No bulging varicosities in the right leg. Multiple spider and reticular veins in the thigh and calf areas bilaterally. No hyperpigmentation noted.  I reviewed the duplex scan of the lower extremities and there is not enough significant reflux in the great saphenous or small saphenous systems and the caliber is not large enough to require laser ablation        Assessment:     Bilateral lower extremity edema-possible lymphedema-no evidence of serious enough reflux to warrant laser ablation therapy    Plan:     #1 elevate for the bed 3-4 inches #2 apply a short-leg elastic compression stockings first laying in a.m. #3 no need for further vascular evaluation

## 2013-07-02 ENCOUNTER — Ambulatory Visit: Payer: Medicare Other

## 2013-07-13 DIAGNOSIS — M47817 Spondylosis without myelopathy or radiculopathy, lumbosacral region: Secondary | ICD-10-CM | POA: Insufficient documentation

## 2013-07-13 DIAGNOSIS — M5137 Other intervertebral disc degeneration, lumbosacral region: Secondary | ICD-10-CM | POA: Insufficient documentation

## 2013-07-13 DIAGNOSIS — M129 Arthropathy, unspecified: Secondary | ICD-10-CM | POA: Insufficient documentation

## 2013-07-13 DIAGNOSIS — M5030 Other cervical disc degeneration, unspecified cervical region: Secondary | ICD-10-CM | POA: Insufficient documentation

## 2013-07-13 DIAGNOSIS — M353 Polymyalgia rheumatica: Secondary | ICD-10-CM | POA: Insufficient documentation

## 2013-07-13 DIAGNOSIS — M5412 Radiculopathy, cervical region: Secondary | ICD-10-CM | POA: Insufficient documentation

## 2013-07-13 DIAGNOSIS — M51379 Other intervertebral disc degeneration, lumbosacral region without mention of lumbar back pain or lower extremity pain: Secondary | ICD-10-CM | POA: Insufficient documentation

## 2013-07-13 DIAGNOSIS — G7000 Myasthenia gravis without (acute) exacerbation: Secondary | ICD-10-CM | POA: Insufficient documentation

## 2013-07-16 ENCOUNTER — Ambulatory Visit (INDEPENDENT_AMBULATORY_CARE_PROVIDER_SITE_OTHER): Payer: Medicare Other

## 2013-07-16 VITALS — BP 111/57 | HR 76 | Resp 12

## 2013-07-16 DIAGNOSIS — L608 Other nail disorders: Secondary | ICD-10-CM

## 2013-07-16 DIAGNOSIS — Q828 Other specified congenital malformations of skin: Secondary | ICD-10-CM

## 2013-07-16 DIAGNOSIS — E1142 Type 2 diabetes mellitus with diabetic polyneuropathy: Secondary | ICD-10-CM

## 2013-07-16 DIAGNOSIS — E1140 Type 2 diabetes mellitus with diabetic neuropathy, unspecified: Secondary | ICD-10-CM

## 2013-07-16 DIAGNOSIS — E1149 Type 2 diabetes mellitus with other diabetic neurological complication: Secondary | ICD-10-CM

## 2013-07-16 NOTE — Progress Notes (Signed)
   Subjective:    Patient ID: Alexandra Hogan, female    DOB: 01/13/1929, 80 y.o.   MRN: [id number]  HPI I NEED TO HAVE MY TOENAILS TRIMMED UP    Review of Systems no new changes or findings     Objective:   Physical Exam Vascular status is intact DP and PT plus one over 4 bilateral capillary refill time 3 seconds all digits epicritic sensation shows hypersensitivity patient does have a diabetic neuropathy as well as history of fibromyalgia. There is toes incurvation friability of nails 1 through 5 bilateral painful tender and symptomatic also this time keratoses sub-5 bilateral with a poor keratotic lesion is identified consistent with for a keratosis or corn or callus patient is mild + edema. No open wounds ulcerations no secondary infection is noted at this time      Assessment & Plan:  Assessment this time his diabetes with peripheral neuropathy painful nails 1 through 5 bilateral debridement at this time also keratoses sub-5 bilateral debridement at this time. Return in 3 months for continued palliative foot nail care advised to contact me change difficulties in the interim. No signs of infection cause of his knee exacerbations  Alvan Dameichard Icesis Renn DPM

## 2013-07-16 NOTE — Patient Instructions (Signed)
Diabetes and Foot Care Diabetes may cause you to have problems because of poor blood supply (circulation) to your feet and legs. This may cause the skin on your feet to become thinner, break easier, and heal more slowly. Your skin may become dry, and the skin may peel and crack. You may also have nerve damage in your legs and feet causing decreased feeling in them. You may not notice minor injuries to your feet that could lead to infections or more serious problems. Taking care of your feet is one of the most important things you can do for yourself.  HOME CARE INSTRUCTIONS  Wear shoes at all times, even in the house. Do not go barefoot. Bare feet are easily injured.  Check your feet daily for blisters, cuts, and redness. If you cannot see the bottom of your feet, use a mirror or ask someone for help.  Wash your feet with warm water (do not use hot water) and mild soap. Then pat your feet and the areas between your toes until they are completely dry. Do not soak your feet as this can dry your skin.  Apply a moisturizing lotion or petroleum jelly (that does not contain alcohol and is unscented) to the skin on your feet and to dry, brittle toenails. Do not apply lotion between your toes.  Trim your toenails straight across. Do not dig under them or around the cuticle. File the edges of your nails with an emery board or nail file.  Do not cut corns or calluses or try to remove them with medicine.  Wear clean socks or stockings every day. Make sure they are not too tight. Do not wear knee-high stockings since they may decrease blood flow to your legs.  Wear shoes that fit properly and have enough cushioning. To break in new shoes, wear them for just a few hours a day. This prevents you from injuring your feet. Always look in your shoes before you put them on to be sure there are no objects inside.  Do not cross your legs. This may decrease the blood flow to your feet.  If you find a minor scrape,  cut, or break in the skin on your feet, keep it and the skin around it clean and dry. These areas may be cleansed with mild soap and water. Do not cleanse the area with peroxide, alcohol, or iodine.  When you remove an adhesive bandage, be sure not to damage the skin around it.  If you have a wound, look at it several times a day to make sure it is healing.  Do not use heating pads or hot water bottles. They may burn your skin. If you have lost feeling in your feet or legs, you may not know it is happening until it is too late.  Make sure your health care provider performs a complete foot exam at least annually or more often if you have foot problems. Report any cuts, sores, or bruises to your health care provider immediately. SEEK MEDICAL CARE IF:   You have an injury that is not healing.  You have cuts or breaks in the skin.  You have an ingrown nail.  You notice redness on your legs or feet.  You feel burning or tingling in your legs or feet.  You have pain or cramps in your legs and feet.  Your legs or feet are numb.  Your feet always feel cold. SEEK IMMEDIATE MEDICAL CARE IF:   There is increasing redness,   swelling, or pain in or around a wound.  There is a red line that goes up your leg.  Pus is coming from a wound.  You develop a fever or as directed by your health care provider.  You notice a bad smell coming from an ulcer or wound. Document Released: 03/30/2000 Document Revised: 12/03/2012 Document Reviewed: 09/09/2012 [employer] Patient Information 2014 [employer].  

## 2013-10-29 ENCOUNTER — Ambulatory Visit (INDEPENDENT_AMBULATORY_CARE_PROVIDER_SITE_OTHER): Payer: Medicare Other

## 2013-10-29 VITALS — BP 142/62 | HR 73 | Resp 18

## 2013-10-29 DIAGNOSIS — L608 Other nail disorders: Secondary | ICD-10-CM

## 2013-10-29 DIAGNOSIS — E1149 Type 2 diabetes mellitus with other diabetic neurological complication: Secondary | ICD-10-CM

## 2013-10-29 DIAGNOSIS — Q828 Other specified congenital malformations of skin: Secondary | ICD-10-CM

## 2013-10-29 DIAGNOSIS — E1140 Type 2 diabetes mellitus with diabetic neuropathy, unspecified: Secondary | ICD-10-CM

## 2013-10-29 NOTE — Progress Notes (Signed)
   Subjective:    Patient ID: Alexandra SoursBeulah U Swire, female    DOB: 02/26/1929, 80 y.o.   MRN: [id number]  HPI  I AM HERE TO HAVE MY TOENAILS TRIMMED UP AND I SLIPPED OFF THE BED ABOUT A WEEK AGO AND I AM ALL BRUISED UP     Review of Systems no new findings or systemic changes noted only other new symptom is patient did have a fall about a week ago and hit her foot against the dresser patient has ecchymosis of the lower ankle medial arch and forefoot interdigital area on her left foot. There is significant ecchymosis and contusion and +1 to +2 edema. No x-rays were taken patient ambulating indicates no pain or symptoms     Objective:   Physical Exam Lower extremity objective findings reveal DP and PT plus one over 4 bilateral mild varicosities and and ecchymosis of left foot and ankle due to trauma week ago. Decreased epicritic sensation Semmes Weinstein testing to forefoot digits and plantar arch and heel intact sensation of dorsum of foot and ankle. Keratotic lesion sub-fifth MTP area left with nucleated keratoses consistent with verruca versus porokeratosis right foot has no keratotic lesions patient does have some difficulty with ambulation and again thick dystrophic criptotic nails 1 through 5 bilateral       Assessment & Plan:  Assessment diabetes with history peripheral neuropathy thick brittle dystrophic mycotic nails debrided 1 through 5 bilateral also single keratotic lesion 4 keratoses sub-5 left is debrided on debridement a year history with Neosporin and a Band-Aid to cushion the area patient wearing accommodative type shoes such is athletic shoe or walking shoe may benefit from diabetic for extra-depth shoes in the future followup as needed with in 2-3 months  Alvan Dameichard Para Cossey DPM

## 2013-10-29 NOTE — Patient Instructions (Signed)
Diabetes and Foot Care Diabetes may cause you to have problems because of poor blood supply (circulation) to your feet and legs. This may cause the skin on your feet to become thinner, break easier, and heal more slowly. Your skin may become dry, and the skin may peel and crack. You may also have nerve damage in your legs and feet causing decreased feeling in them. You may not notice minor injuries to your feet that could lead to infections or more serious problems. Taking care of your feet is one of the most important things you can do for yourself.  HOME CARE INSTRUCTIONS  Wear shoes at all times, even in the house. Do not go barefoot. Bare feet are easily injured.  Check your feet daily for blisters, cuts, and redness. If you cannot see the bottom of your feet, use a mirror or ask someone for help.  Wash your feet with warm water (do not use hot water) and mild soap. Then pat your feet and the areas between your toes until they are completely dry. Do not soak your feet as this can dry your skin.  Apply a moisturizing lotion or petroleum jelly (that does not contain alcohol and is unscented) to the skin on your feet and to dry, brittle toenails. Do not apply lotion between your toes.  Trim your toenails straight across. Do not dig under them or around the cuticle. File the edges of your nails with an emery board or nail file.  Do not cut corns or calluses or try to remove them with medicine.  Wear clean socks or stockings every day. Make sure they are not too tight. Do not wear knee-high stockings since they may decrease blood flow to your legs.  Wear shoes that fit properly and have enough cushioning. To break in new shoes, wear them for just a few hours a day. This prevents you from injuring your feet. Always look in your shoes before you put them on to be sure there are no objects inside.  Do not cross your legs. This may decrease the blood flow to your feet.  If you find a minor scrape,  cut, or break in the skin on your feet, keep it and the skin around it clean and dry. These areas may be cleansed with mild soap and water. Do not cleanse the area with peroxide, alcohol, or iodine.  When you remove an adhesive bandage, be sure not to damage the skin around it.  If you have a wound, look at it several times a day to make sure it is healing.  Do not use heating pads or hot water bottles. They may burn your skin. If you have lost feeling in your feet or legs, you may not know it is happening until it is too late.  Make sure your health care provider performs a complete foot exam at least annually or more often if you have foot problems. Report any cuts, sores, or bruises to your health care provider immediately. SEEK MEDICAL CARE IF:   You have an injury that is not healing.  You have cuts or breaks in the skin.  You have an ingrown nail.  You notice redness on your legs or feet.  You feel burning or tingling in your legs or feet.  You have pain or cramps in your legs and feet.  Your legs or feet are numb.  Your feet always feel cold. SEEK IMMEDIATE MEDICAL CARE IF:   There is increasing redness,   swelling, or pain in or around a wound.  There is a red line that goes up your leg.  Pus is coming from a wound.  You develop a fever or as directed by your health care provider.  You notice a bad smell coming from an ulcer or wound. Document Released: 03/30/2000 Document Revised: 12/03/2012 Document Reviewed: 09/09/2012 Exit[employer] Patient Information 2015 [employer]. This information is not intended to replace advice given to you by your health care provider. Make sure you discuss any questions you have with your health care provider.  

## 2013-12-30 ENCOUNTER — Ambulatory Visit (INDEPENDENT_AMBULATORY_CARE_PROVIDER_SITE_OTHER): Payer: Medicare Other

## 2013-12-30 VITALS — BP 111/53 | HR 86 | Resp 18

## 2013-12-30 DIAGNOSIS — E1142 Type 2 diabetes mellitus with diabetic polyneuropathy: Secondary | ICD-10-CM

## 2013-12-30 DIAGNOSIS — E1140 Type 2 diabetes mellitus with diabetic neuropathy, unspecified: Secondary | ICD-10-CM

## 2013-12-30 DIAGNOSIS — Q828 Other specified congenital malformations of skin: Secondary | ICD-10-CM

## 2013-12-30 DIAGNOSIS — E1149 Type 2 diabetes mellitus with other diabetic neurological complication: Secondary | ICD-10-CM

## 2013-12-30 DIAGNOSIS — L608 Other nail disorders: Secondary | ICD-10-CM

## 2013-12-30 NOTE — Patient Instructions (Signed)
Diabetes and Foot Care Diabetes may cause you to have problems because of poor blood supply (circulation) to your feet and legs. This may cause the skin on your feet to become thinner, break easier, and heal more slowly. Your skin may become dry, and the skin may peel and crack. You may also have nerve damage in your legs and feet causing decreased feeling in them. You may not notice minor injuries to your feet that could lead to infections or more serious problems. Taking care of your feet is one of the most important things you can do for yourself.  HOME CARE INSTRUCTIONS  Wear shoes at all times, even in the house. Do not go barefoot. Bare feet are easily injured.  Check your feet daily for blisters, cuts, and redness. If you cannot see the bottom of your feet, use a mirror or ask someone for help.  Wash your feet with warm water (do not use hot water) and mild soap. Then pat your feet and the areas between your toes until they are completely dry. Do not soak your feet as this can dry your skin.  Apply a moisturizing lotion or petroleum jelly (that does not contain alcohol and is unscented) to the skin on your feet and to dry, brittle toenails. Do not apply lotion between your toes.  Trim your toenails straight across. Do not dig under them or around the cuticle. File the edges of your nails with an emery board or nail file.  Do not cut corns or calluses or try to remove them with medicine.  Wear clean socks or stockings every day. Make sure they are not too tight. Do not wear knee-high stockings since they may decrease blood flow to your legs.  Wear shoes that fit properly and have enough cushioning. To break in new shoes, wear them for just a few hours a day. This prevents you from injuring your feet. Always look in your shoes before you put them on to be sure there are no objects inside.  Do not cross your legs. This may decrease the blood flow to your feet.  If you find a minor scrape,  cut, or break in the skin on your feet, keep it and the skin around it clean and dry. These areas may be cleansed with mild soap and water. Do not cleanse the area with peroxide, alcohol, or iodine.  When you remove an adhesive bandage, be sure not to damage the skin around it.  If you have a wound, look at it several times a day to make sure it is healing.  Do not use heating pads or hot water bottles. They may burn your skin. If you have lost feeling in your feet or legs, you may not know it is happening until it is too late.  Make sure your health care provider performs a complete foot exam at least annually or more often if you have foot problems. Report any cuts, sores, or bruises to your health care provider immediately. SEEK MEDICAL CARE IF:   You have an injury that is not healing.  You have cuts or breaks in the skin.  You have an ingrown nail.  You notice redness on your legs or feet.  You feel burning or tingling in your legs or feet.  You have pain or cramps in your legs and feet.  Your legs or feet are numb.  Your feet always feel cold. SEEK IMMEDIATE MEDICAL CARE IF:   There is increasing redness,   swelling, or pain in or around a wound.  There is a red line that goes up your leg.  Pus is coming from a wound.  You develop a fever or as directed by your health care provider.  You notice a bad smell coming from an ulcer or wound. Document Released: 03/30/2000 Document Revised: 12/03/2012 Document Reviewed: 09/09/2012 Exit[employer] Patient Information 2015 [employer]. This information is not intended to replace advice given to you by your health care provider. Make sure you discuss any questions you have with your health care provider.  

## 2013-12-30 NOTE — Progress Notes (Signed)
   Subjective:    Patient ID: Alexandra Hogan, female    DOB: 02-28-1929, 80 y.o.   MRN: [id number]  HPI I NEED MY NAILS CUT    Review of Systems no new findings or systemic changes noted her patient notes that her pain in her legs knees and ankles his gotten worse including her toes and feet patient has hyperesthesia and significant hypersensitivity both lower extremities suspect likely with her diabetes neuropathy as well as fibromyalgia like symptoms. Has been treated with Lyrica however him to reduce her Lyrica dose to the edema side effect     Objective:   Physical Exam Lower extremity objective findings reveal pedal pulses DP and PT plus one over 4 mild varicosities noted mild edema and ecchymosis of the foot. There is no open wounds no ulcers no secondary infections keratoses sub-5 bilateral and pinch callus first MTP area right nails thick criptotic incurvated and brittle one through 5 bilateral no open wounds or ulcerations are otherwise noted no secondary infection and semirigid digital contractures are identified 2 through 5       Assessment & Plan:  Assessment diabetes with history peripheral neuropathy and angiopathy as well as is chronic pain in symptomology fibromyalgia. Nails debrided 1 through 5 bilateral also multiple keratoses sub-5 bilateral pinch callus first right are debrided maintain appropriate accommodative cushion shoes at all times continue with a vitamin supplement B6 and B12 and folic acid contact us visiting changes or exacerbations occur in the interim. Recheck within 2-3 months for followup and continued palliative care is needed  Alvan Dame DPM

## 2014-01-21 ENCOUNTER — Other Ambulatory Visit: Payer: Self-pay | Admitting: Neurosurgery

## 2014-02-01 ENCOUNTER — Other Ambulatory Visit: Payer: Self-pay | Admitting: Neurological Surgery

## 2014-02-01 DIAGNOSIS — M25552 Pain in left hip: Secondary | ICD-10-CM

## 2014-02-10 ENCOUNTER — Inpatient Hospital Stay: Admission: RE | Admit: 2014-02-10 | Payer: Medicare Other | Source: Ambulatory Visit

## 2014-02-10 ENCOUNTER — Other Ambulatory Visit: Payer: Medicare Other

## 2014-02-22 ENCOUNTER — Ambulatory Visit
Admission: RE | Admit: 2014-02-22 | Discharge: 2014-02-22 | Disposition: A | Discharge status: Home / Self Care | Payer: Medicare Other | Source: Ambulatory Visit | Attending: Neurosurgery | Admitting: Neurosurgery

## 2014-02-22 ENCOUNTER — Ambulatory Visit
Admission: RE | Admit: 2014-02-22 | Discharge: 2014-02-22 | Disposition: A | Discharge status: Home / Self Care | Payer: Medicare Other | Source: Ambulatory Visit | Attending: Neurological Surgery | Admitting: Neurological Surgery

## 2014-02-22 ENCOUNTER — Other Ambulatory Visit: Payer: Self-pay | Admitting: Neurosurgery

## 2014-02-22 DIAGNOSIS — M25552 Pain in left hip: Secondary | ICD-10-CM

## 2014-02-22 MED ORDER — IOHEXOL 180 MG/ML  SOLN
10.0000 mL | Freq: Once | INTRAMUSCULAR | Status: AC | PRN
  Administered 2014-02-22: 10 mL via INTRA_ARTICULAR

## 2014-03-08 ENCOUNTER — Ambulatory Visit (INDEPENDENT_AMBULATORY_CARE_PROVIDER_SITE_OTHER): Payer: Medicare Other

## 2014-03-08 VITALS — BP 119/60 | HR 70 | Resp 12

## 2014-03-08 DIAGNOSIS — L603 Nail dystrophy: Secondary | ICD-10-CM

## 2014-03-08 DIAGNOSIS — E1140 Type 2 diabetes mellitus with diabetic neuropathy, unspecified: Secondary | ICD-10-CM

## 2014-03-08 DIAGNOSIS — Q828 Other specified congenital malformations of skin: Secondary | ICD-10-CM

## 2014-03-08 NOTE — Patient Instructions (Signed)
Diabetes and Foot Care Diabetes may cause you to have problems because of poor blood supply (circulation) to your feet and legs. This may cause the skin on your feet to become thinner, break easier, and heal more slowly. Your skin may become dry, and the skin may peel and crack. You may also have nerve damage in your legs and feet causing decreased feeling in them. You may not notice minor injuries to your feet that could lead to infections or more serious problems. Taking care of your feet is one of the most important things you can do for yourself.  HOME CARE INSTRUCTIONS  Wear shoes at all times, even in the house. Do not go barefoot. Bare feet are easily injured.  Check your feet daily for blisters, cuts, and redness. If you cannot see the bottom of your feet, use a mirror or ask someone for help.  Wash your feet with warm water (do not use hot water) and mild soap. Then pat your feet and the areas between your toes until they are completely dry. Do not soak your feet as this can dry your skin.  Apply a moisturizing lotion or petroleum jelly (that does not contain alcohol and is unscented) to the skin on your feet and to dry, brittle toenails. Do not apply lotion between your toes.  Trim your toenails straight across. Do not dig under them or around the cuticle. File the edges of your nails with an emery board or nail file.  Do not cut corns or calluses or try to remove them with medicine.  Wear clean socks or stockings every day. Make sure they are not too tight. Do not wear knee-high stockings since they may decrease blood flow to your legs.  Wear shoes that fit properly and have enough cushioning. To break in new shoes, wear them for just a few hours a day. This prevents you from injuring your feet. Always look in your shoes before you put them on to be sure there are no objects inside.  Do not cross your legs. This may decrease the blood flow to your feet.  If you find a minor scrape,  cut, or break in the skin on your feet, keep it and the skin around it clean and dry. These areas may be cleansed with mild soap and water. Do not cleanse the area with peroxide, alcohol, or iodine.  When you remove an adhesive bandage, be sure not to damage the skin around it.  If you have a wound, look at it several times a day to make sure it is healing.  Do not use heating pads or hot water bottles. They may burn your skin. If you have lost feeling in your feet or legs, you may not know it is happening until it is too late.  Make sure your health care provider performs a complete foot exam at least annually or more often if you have foot problems. Report any cuts, sores, or bruises to your health care provider immediately. SEEK MEDICAL CARE IF:   You have an injury that is not healing.  You have cuts or breaks in the skin.  You have an ingrown nail.  You notice redness on your legs or feet.  You feel burning or tingling in your legs or feet.  You have pain or cramps in your legs and feet.  Your legs or feet are numb.  Your feet always feel cold. SEEK IMMEDIATE MEDICAL CARE IF:   There is increasing redness,   swelling, or pain in or around a wound.  There is a red line that goes up your leg.  Pus is coming from a wound.  You develop a fever or as directed by your health care provider.  You notice a bad smell coming from an ulcer or wound. Document Released: 03/30/2000 Document Revised: 12/03/2012 Document Reviewed: 09/09/2012 Exit[employer] Patient Information 2015 [employer]. This information is not intended to replace advice given to you by your health care provider. Make sure you discuss any questions you have with your health care provider.  

## 2014-03-08 NOTE — Progress Notes (Signed)
   Subjective:    Patient ID: Alexandra Hogan, female    DOB: 05/26/1928, 80 y.o.   MRN: [id number]  HPI  TRIM MY TOENAILS.  Review of Systems no new findings or systemic changes noted     Objective:   Physical Exam 80 year old female options was husband present well-developed well-nourished oriented 3 does have continued distress history fibromyalgia with chronic pain symptoms while G as well as diabetic neuropathy issues. Lower extremity findings reveal pedal pulses DP plus one over 4 PT thready one over 4 with +2 edema both ankles mild varicosities noted temperature warm turgor diminished there again edema noted +2 there is some pain symptomology and the forefoot sub-fifth MTP area there is diffuse keratoses no acute keratotic lesion did not require debridement at this time patient is sensitivity distal tuft the second digit right foot there is some thickening of both second digit nails consistent with onychomycosis remaining nails thick criptotic incurvated friable painful and tender both on ambulation and inflow shoewear and on palpation. Patient is sensitivity in the forefoot ball the foot metatarsal joints and digital joints due to fibromyalgia and complications. Nails thick brittle crumbly friable dystrophic 1 through 5 bilateral debrided at this time patient asked about having her nail removed on the second toe however there was advised even removing this nails and I can eliminate her fibromyalgia her neuropathy symptoms she'll likely continue to have some hyperesthesia.       Assessment & Plan:  Assessment this time is diabetes history peripheral neuropathy and fiber myalgia chronic pain symptomology. Plan at this time debridement of nails 1 through 5 bilateral painful tender symptomatic nails and the presence of pain symptomology and neuropathy callus did not require treatment at this time or debridement may require debridement the future continue with current vitamin supplementation  appropriate accommodative shoes return in 2 to between 2 and 3 months for continued palliative care in the future as needed  Alvan Dameichard Maddyx Wieck DPM

## 2014-03-18 ENCOUNTER — Other Ambulatory Visit: Payer: Self-pay | Admitting: Orthopedic Surgery

## 2014-03-18 DIAGNOSIS — M5416 Radiculopathy, lumbar region: Secondary | ICD-10-CM

## 2014-03-18 DIAGNOSIS — S32020A Wedge compression fracture of second lumbar vertebra, initial encounter for closed fracture: Secondary | ICD-10-CM

## 2014-03-19 ENCOUNTER — Ambulatory Visit
Admission: RE | Admit: 2014-03-19 | Discharge: 2014-03-19 | Disposition: A | Discharge status: Home / Self Care | Payer: Medicare Other | Source: Ambulatory Visit | Attending: Orthopedic Surgery | Admitting: Orthopedic Surgery

## 2014-03-19 DIAGNOSIS — M5416 Radiculopathy, lumbar region: Secondary | ICD-10-CM

## 2014-03-19 DIAGNOSIS — S32020A Wedge compression fracture of second lumbar vertebra, initial encounter for closed fracture: Secondary | ICD-10-CM

## 2014-05-10 ENCOUNTER — Ambulatory Visit: Payer: Medicare Other

## 2014-05-17 ENCOUNTER — Ambulatory Visit (INDEPENDENT_AMBULATORY_CARE_PROVIDER_SITE_OTHER): Payer: Medicare Other

## 2014-05-17 VITALS — BP 118/68 | HR 70 | Resp 18

## 2014-05-17 DIAGNOSIS — E1140 Type 2 diabetes mellitus with diabetic neuropathy, unspecified: Secondary | ICD-10-CM | POA: Diagnosis not present

## 2014-05-17 DIAGNOSIS — L603 Nail dystrophy: Secondary | ICD-10-CM

## 2014-05-17 DIAGNOSIS — Q828 Other specified congenital malformations of skin: Secondary | ICD-10-CM | POA: Diagnosis not present

## 2014-05-17 NOTE — Progress Notes (Signed)
   Subjective:    Patient ID: Alexandra SoursBeulah U Kofman, female    DOB: 12/24/1928, 81 y.o.   MRN: [id number]  HPI I AM HERE TO GET MY TOENAILS TRIMMED UP    Review of Systems no new findings or systemic changes noted     Objective:   Physical Exam Lower extremity objective findings unchanged patient has severe petechiae and bruising both lower extremities due to the Coumadin use. Very easily has multiple bruises both legs right more so than left pedal pulses revealed DP plus one over 4 PT thready at one over 4+2 edema bilateral ankles epicritic sensations diminished on Semmes Weinstein to the forefoot and digits however patient has severe hyperesthesia in the forefoot digital areas sub-5 left sub-1 right has hemorrhage a keratoses which are debrided back at this time. There is paresthesia burning sensation abnormal sensation bottoms of both feet hypersensitivity there is history of arthropathy fibromyalgia little Crumley friable mycotic nails 1 through 5 bilateral are debrided at this time. Also multiple keratoses sub-1 right sub-5 left are debrided.       Assessment & Plan:  Assessment this time is diabetes history peripheral neuropathy and angiopathy and chronic pain symptomology. Patient also has significant ecchymosis and bruising due to anticoagulation for A. fib. At this time continue to monitor the areas no active wounds no ulcers no secondary infection on debridement the hallux bilateral treated with limited cane and Neosporin as well as the fourth digit right foot. There is keratoses sub-1 right sub-for left which or debridement or sub-5 left which are debrided at this time as well return for future palliative care is needed suggest a 2-3 month follow-up for nail care  Alvan Dameichard Delfina Schreurs DPM

## 2014-07-19 ENCOUNTER — Ambulatory Visit: Payer: Medicare Other

## 2014-08-02 ENCOUNTER — Ambulatory Visit: Payer: Medicare Other

## 2014-08-12 ENCOUNTER — Ambulatory Visit (INDEPENDENT_AMBULATORY_CARE_PROVIDER_SITE_OTHER): Payer: Medicare Other | Admitting: Podiatrist

## 2014-08-12 ENCOUNTER — Encounter: Payer: Self-pay | Admitting: Podiatrist

## 2014-08-12 VITALS — BP 138/70 | HR 62 | Resp 18

## 2014-08-12 DIAGNOSIS — L603 Nail dystrophy: Secondary | ICD-10-CM

## 2014-08-12 DIAGNOSIS — B351 Tinea unguium: Secondary | ICD-10-CM

## 2014-08-12 DIAGNOSIS — Q828 Other specified congenital malformations of skin: Secondary | ICD-10-CM | POA: Diagnosis not present

## 2014-08-12 DIAGNOSIS — M79676 Pain in unspecified toe(s): Secondary | ICD-10-CM

## 2014-08-12 DIAGNOSIS — E1140 Type 2 diabetes mellitus with diabetic neuropathy, unspecified: Secondary | ICD-10-CM | POA: Diagnosis not present

## 2014-08-12 NOTE — Progress Notes (Signed)
   HPI:  Patient presents today for follow up of foot and nail care. She had her anterior right shin against her dishwasher and has a skin tear. Today the dressing that was just put on is draining bloody fluid out from under it.  Objective:  Patients chart is reviewed.  Vascular status reveals pedal pulses noted at 1 out of 4 dp and pt bilateral .  Neurological sensation is intact to [facility] bilateral.  Patients nails are thickened, discolored, distrophic, friable and brittle with yellow-brown discoloration. Patient subjectively relates they are painful with shoes and with ambulation of bilateral feet. Callus the metatarsal one of the right foot and sub-metatarsal 5 of the left foot is also present. It is tender and symptomatic with intact integument present post-debridement. Small  skin tear anterior right ankle with bruising around it from blood thinners. No sign of infection is noted.   Assessment:  Symptomatic onychomycosis, calluses, skin tear anterior ankle  Plan:  Discussed treatment options and alternatives.  The symptomatic toenails and calluses were debrided through manual an mechanical means without complication.  I also did a dressing change on her anterior ankle wound and apply Silvadene cream and Telfa pad and a lightly compressive dressing to the wound. Return appointment recommended at routine intervals of 3 months    Marlowe AschoffKathryn Tarrance Januszewski, DPM

## 2014-09-22 ENCOUNTER — Ambulatory Visit: Payer: Medicare Other | Admitting: Podiatry

## 2014-09-27 NOTE — Patient Outreach (Signed)
[facility]) Care Management  09/27/2014  Alexandra Hogan [birth year] [id number]   Referral with MD notes from High Risk List, assigned to [name], RN.  [name]. [name] [place] Care Management [facility] CM Assistant Phone: [phone] Fax: [phone]

## 2014-10-11 DIAGNOSIS — Z7901 Long term (current) use of anticoagulants: Secondary | ICD-10-CM | POA: Insufficient documentation

## 2014-10-11 DIAGNOSIS — Z950 Presence of cardiac pacemaker: Secondary | ICD-10-CM | POA: Insufficient documentation

## 2014-10-11 DIAGNOSIS — I5032 Chronic diastolic (congestive) heart failure: Secondary | ICD-10-CM | POA: Insufficient documentation

## 2014-10-11 DIAGNOSIS — I480 Paroxysmal atrial fibrillation: Secondary | ICD-10-CM | POA: Insufficient documentation

## 2014-10-19 ENCOUNTER — Other Ambulatory Visit: Payer: Self-pay

## 2014-10-19 NOTE — Patient Outreach (Signed)
[facility] Care Management  10/19/2014  Tammy SoursBeulah U Lanuza 06/15/1928 [id number]   SUBJECTIVE:  Telephone call to patient regarding high risk list referral.  HIPAA verified with patient.  Patient request this RNCM speak with Mr. Kathleen ArgueCharles Moore her caregiver and gave verbal authorization to speak with him regarding all medical information.  Mr. Leonette MostCharles confirmed he was patients caregiver. State [facility] 2 [facility] per [facility].  Mr. Leonette MostCharles states he is able to handle all of patients other needs at this time.  Mr. Leonette MostCharles and patient agreed to [facility] outreach [facility] and brochure for future reference. Mr. Leonette MostCharles, care giver refused [facility] services for patient at this time.  PLAN: RNCM will refer patient to Nena PolioLisa Moore to close due to caregiver refusal of services.  RNCM will notify patients primary care giver of refusal of services. RNCM will send patient outreach letter and brochure as requested.  George InaDavina Haniya Fern RN,BSN,CCM [facility] Telephonic Care Coordinator [phone]

## 2014-11-01 ENCOUNTER — Encounter: Payer: Self-pay | Admitting: Podiatry

## 2014-11-01 ENCOUNTER — Ambulatory Visit (INDEPENDENT_AMBULATORY_CARE_PROVIDER_SITE_OTHER): Payer: Medicare Other | Admitting: Podiatry

## 2014-11-01 VITALS — BP 102/56 | HR 72 | Resp 18

## 2014-11-01 DIAGNOSIS — B351 Tinea unguium: Secondary | ICD-10-CM

## 2014-11-01 DIAGNOSIS — M79609 Pain in unspecified limb: Secondary | ICD-10-CM

## 2014-11-01 DIAGNOSIS — Q828 Other specified congenital malformations of skin: Secondary | ICD-10-CM | POA: Diagnosis not present

## 2014-11-01 DIAGNOSIS — E1140 Type 2 diabetes mellitus with diabetic neuropathy, unspecified: Secondary | ICD-10-CM | POA: Diagnosis not present

## 2014-11-01 NOTE — Progress Notes (Signed)
Patient ID: Alexandra Hogan, female   DOB: 1928/11/06, 81 y.o.   MRN: [id number] Complaint:  Visit Type: Patient returns to my office for continued preventative foot care services. Complaint: Patient states" her calluses under both feet have become very painful.   Patient has been diagnosed with DM with neuropathy.. She presents for preventative foot care services. No changes to ROS.  She has a dorsal ulcer on dorsum of left foot which is treated by another doctor.  Podiatric Exam: Vascular: dorsalis pedis and posterior tibial pulses are palpable bilateral. Capillary return is immediate. Temperature gradient is WNL. Skin turgor WNL  Sensorium: Normal Semmes Weinstein monofilament test. Normal tactile sensation bilaterally. Nail Exam: Pt has thick disfigured discolored nails with subungual debris noted bilateral entire nail hallux through fifth toenails Ulcer Exam:  Debridement of callus sub 5th met left foot reveals a 2x47mm ulcer with no signs of redness or infection or drainage.. Orthopedic Exam: Muscle tone and strength are WNL. No limitations in general ROM. No crepitus or effusions noted. Foot type and digits show no abnormalities. Bony prominences are unremarkable.   SKIN  Callus sub 1 left and sub1 right.  Diagnosis:  Tinea unguium, Pain in right toe, pain in left toes, Callus sib 1st B/L  Ulcer sub 5th left  Treatment & Plan Procedures and Treatment: Consent by patient was obtained for treatment procedures. The patient understood the discussion of treatment and procedures well. All questions were answered thoroughly reviewed. Debridement of mycotic and hypertrophic toenails, 1 through 5 bilateral and clearing of subungual debris. No ulceration, no infection noted. Debride callus.  Debride ulcer. Return Visit-Office Procedure: Patient instructed to return to the office for a follow up visit 3 months for continued evaluation and treatment.

## 2014-11-30 ENCOUNTER — Encounter (HOSPITAL_COMMUNITY): Payer: Self-pay | Admitting: *Deleted

## 2014-11-30 ENCOUNTER — Emergency Department (HOSPITAL_COMMUNITY): Payer: Medicare Other

## 2014-11-30 ENCOUNTER — Observation Stay (HOSPITAL_COMMUNITY)
Admission: EM | Admit: 2014-11-30 | Discharge: 2014-12-16 | Disposition: E | Payer: Medicare Other | Attending: Internal Medicine | Admitting: Internal Medicine

## 2014-11-30 DIAGNOSIS — D649 Anemia, unspecified: Secondary | ICD-10-CM | POA: Insufficient documentation

## 2014-11-30 DIAGNOSIS — Z888 Allergy status to other drugs, medicaments and biological substances status: Secondary | ICD-10-CM | POA: Insufficient documentation

## 2014-11-30 DIAGNOSIS — R451 Restlessness and agitation: Secondary | ICD-10-CM | POA: Diagnosis present

## 2014-11-30 DIAGNOSIS — G931 Anoxic brain damage, not elsewhere classified: Secondary | ICD-10-CM | POA: Diagnosis present

## 2014-11-30 DIAGNOSIS — G8929 Other chronic pain: Secondary | ICD-10-CM | POA: Diagnosis present

## 2014-11-30 DIAGNOSIS — R4182 Altered mental status, unspecified: Secondary | ICD-10-CM | POA: Diagnosis not present

## 2014-11-30 DIAGNOSIS — M4800 Spinal stenosis, site unspecified: Secondary | ICD-10-CM | POA: Diagnosis present

## 2014-11-30 DIAGNOSIS — Z978 Presence of other specified devices: Secondary | ICD-10-CM

## 2014-11-30 DIAGNOSIS — M1990 Unspecified osteoarthritis, unspecified site: Secondary | ICD-10-CM | POA: Diagnosis not present

## 2014-11-30 DIAGNOSIS — Z7902 Long term (current) use of antithrombotics/antiplatelets: Secondary | ICD-10-CM | POA: Diagnosis not present

## 2014-11-30 DIAGNOSIS — L8990 Pressure ulcer of unspecified site, unspecified stage: Secondary | ICD-10-CM | POA: Diagnosis present

## 2014-11-30 DIAGNOSIS — Z515 Encounter for palliative care: Secondary | ICD-10-CM | POA: Diagnosis not present

## 2014-11-30 DIAGNOSIS — Z7982 Long term (current) use of aspirin: Secondary | ICD-10-CM | POA: Insufficient documentation

## 2014-11-30 DIAGNOSIS — Z885 Allergy status to narcotic agent status: Secondary | ICD-10-CM | POA: Insufficient documentation

## 2014-11-30 DIAGNOSIS — I4891 Unspecified atrial fibrillation: Secondary | ICD-10-CM | POA: Diagnosis not present

## 2014-11-30 DIAGNOSIS — Z79899 Other long term (current) drug therapy: Secondary | ICD-10-CM | POA: Insufficient documentation

## 2014-11-30 DIAGNOSIS — Z7952 Long term (current) use of systemic steroids: Secondary | ICD-10-CM | POA: Insufficient documentation

## 2014-11-30 DIAGNOSIS — I469 Cardiac arrest, cause unspecified: Secondary | ICD-10-CM | POA: Diagnosis not present

## 2014-11-30 DIAGNOSIS — E119 Type 2 diabetes mellitus without complications: Secondary | ICD-10-CM | POA: Diagnosis not present

## 2014-11-30 DIAGNOSIS — I509 Heart failure, unspecified: Secondary | ICD-10-CM

## 2014-11-30 DIAGNOSIS — I10 Essential (primary) hypertension: Secondary | ICD-10-CM | POA: Diagnosis not present

## 2014-11-30 DIAGNOSIS — T50905A Adverse effect of unspecified drugs, medicaments and biological substances, initial encounter: Secondary | ICD-10-CM

## 2014-11-30 DIAGNOSIS — Z789 Other specified health status: Secondary | ICD-10-CM | POA: Diagnosis present

## 2014-11-30 DIAGNOSIS — R0689 Other abnormalities of breathing: Secondary | ICD-10-CM | POA: Diagnosis not present

## 2014-11-30 DIAGNOSIS — Z66 Do not resuscitate: Secondary | ICD-10-CM | POA: Diagnosis not present

## 2014-11-30 DIAGNOSIS — Z8679 Personal history of other diseases of the circulatory system: Secondary | ICD-10-CM

## 2014-11-30 DIAGNOSIS — E876 Hypokalemia: Secondary | ICD-10-CM | POA: Diagnosis not present

## 2014-11-30 HISTORY — DX: Cardiac murmur, unspecified: R01.1

## 2014-11-30 HISTORY — DX: Cardiac arrest, cause unspecified: I46.9

## 2014-11-30 HISTORY — DX: Pneumonia, unspecified organism: J18.9

## 2014-11-30 HISTORY — DX: Gastro-esophageal reflux disease without esophagitis: K21.9

## 2014-11-30 HISTORY — DX: Acute embolism and thrombosis of unspecified deep veins of unspecified lower extremity: I82.409

## 2014-11-30 HISTORY — DX: Type 2 diabetes mellitus without complications: E11.9

## 2014-11-30 HISTORY — DX: Pure hypercholesterolemia, unspecified: E78.00

## 2014-11-30 LAB — COMPREHENSIVE METABOLIC PANEL
ALT: 46 U/L (ref 14–54)
AST: 147 U/L — AB (ref 15–41)
Albumin: 2.3 g/dL — ABNORMAL LOW (ref 3.5–5.0)
Alkaline Phosphatase: 66 U/L (ref 38–126)
Anion gap: 16 — ABNORMAL HIGH (ref 5–15)
BUN: 7 mg/dL (ref 6–20)
CHLORIDE: 96 mmol/L — AB (ref 101–111)
CO2: 26 mmol/L (ref 22–32)
CREATININE: 1.45 mg/dL — AB (ref 0.44–1.00)
Calcium: 7.8 mg/dL — ABNORMAL LOW (ref 8.9–10.3)
GFR calc Af Amer: 37 mL/min — ABNORMAL LOW (ref >60)
GFR calc non Af Amer: 32 mL/min — ABNORMAL LOW (ref >60)
Glucose, Bld: 255 mg/dL — ABNORMAL HIGH (ref 65–99)
POTASSIUM: 2.4 mmol/L — AB (ref 3.5–5.1)
SODIUM: 138 mmol/L (ref 135–145)
Total Bilirubin: 0.6 mg/dL (ref 0.3–1.2)
Total Protein: 5.4 g/dL — ABNORMAL LOW (ref 6.5–8.1)

## 2014-11-30 LAB — I-STAT CG4 LACTIC ACID, ED: LACTIC ACID, VENOUS: 7.47 mmol/L — AB (ref 0.5–2.0)

## 2014-11-30 LAB — I-STAT ARTERIAL BLOOD GAS, ED
ACID-BASE EXCESS: 7 mmol/L — AB (ref 0.0–2.0)
BICARBONATE: 32 meq/L — AB (ref 20.0–24.0)
O2 Saturation: 100 %
PH ART: 7.428 (ref 7.350–7.450)
PO2 ART: 317 mmHg — AB (ref 80.0–100.0)
TCO2: 33 mmol/L (ref 0–100)
pCO2 arterial: 48.6 mmHg — ABNORMAL HIGH (ref 35.0–45.0)

## 2014-11-30 LAB — CBC WITH DIFFERENTIAL/PLATELET
BASOS ABS: 0 10*3/uL (ref 0.0–0.1)
Basophils Relative: 0 % (ref 0–1)
EOS ABS: 0.5 10*3/uL (ref 0.0–0.7)
Eosinophils Relative: 2 % (ref 0–5)
HCT: 35 % — ABNORMAL LOW (ref 36.0–46.0)
Hemoglobin: 11.4 g/dL — ABNORMAL LOW (ref 12.0–15.0)
LYMPHS PCT: 17 % (ref 12–46)
Lymphs Abs: 4 10*3/uL (ref 0.7–4.0)
MCH: 29.8 pg (ref 26.0–34.0)
MCHC: 32.6 g/dL (ref 30.0–36.0)
MCV: 91.6 fL (ref 78.0–100.0)
Monocytes Absolute: 1 10*3/uL (ref 0.1–1.0)
Monocytes Relative: 4 % (ref 3–12)
NEUTROS PCT: 77 % (ref 43–77)
Neutro Abs: 18.3 10*3/uL — ABNORMAL HIGH (ref 1.7–7.7)
PLATELETS: 309 10*3/uL (ref 150–400)
RBC: 3.82 MIL/uL — AB (ref 3.87–5.11)
RDW: 14.5 % (ref 11.5–15.5)
WBC: 23.8 10*3/uL — AB (ref 4.0–10.5)

## 2014-11-30 LAB — MAGNESIUM: Magnesium: 1.3 mg/dL — ABNORMAL LOW (ref 1.7–2.4)

## 2014-11-30 LAB — I-STAT TROPONIN, ED: TROPONIN I, POC: 0.09 ng/mL — AB (ref 0.00–0.08)

## 2014-11-30 LAB — PHOSPHORUS: PHOSPHORUS: 4 mg/dL (ref 2.5–4.6)

## 2014-11-30 LAB — CDS SEROLOGY

## 2014-11-30 MED ORDER — FENTANYL BOLUS VIA INFUSION
25.0000 ug | INTRAVENOUS | Status: DC | PRN
  Administered 2014-11-30 (×2): 25 ug via INTRAVENOUS
  Filled 2014-11-30: qty 50

## 2014-11-30 MED ORDER — ARTIFICIAL TEARS OP OINT
TOPICAL_OINTMENT | OPHTHALMIC | Status: DC | PRN
  Filled 2014-11-30: qty 3.5

## 2014-11-30 MED ORDER — FENTANYL BOLUS VIA INFUSION
25.0000 ug | INTRAVENOUS | Status: DC | PRN
  Filled 2014-11-30: qty 25

## 2014-11-30 MED ORDER — HALOPERIDOL LACTATE 5 MG/ML IJ SOLN: 2.5000 mg | Freq: Once | INTRAMUSCULAR | Status: DC

## 2014-11-30 MED ORDER — ACETAMINOPHEN 650 MG RE SUPP: 650.0000 mg | RECTAL | Status: DC | PRN

## 2014-11-30 MED ORDER — CHLORPROMAZINE HCL 25 MG PO TABS: 50.0000 mg | ORAL_TABLET | Freq: Once | ORAL | Status: DC

## 2014-11-30 MED ORDER — GLYCOPYRROLATE 1 MG PO TABS
1.0000 mg | ORAL_TABLET | ORAL | Status: DC | PRN
  Filled 2014-11-30: qty 1

## 2014-11-30 MED ORDER — MORPHINE SULFATE (PF) 4 MG/ML IV SOLN
4.0000 mg | Freq: Once | INTRAVENOUS | Status: AC
  Administered 2014-11-30: 4 mg via INTRAVENOUS

## 2014-11-30 MED ORDER — HALOPERIDOL LACTATE 5 MG/ML IJ SOLN
2.0000 mg | Freq: Once | INTRAMUSCULAR | Status: AC
  Administered 2014-11-30: 2 mg via INTRAVENOUS
  Filled 2014-11-30: qty 1

## 2014-11-30 MED ORDER — HALOPERIDOL LACTATE 2 MG/ML PO CONC
0.5000 mg | ORAL | Status: DC | PRN
  Filled 2014-11-30: qty 0.3

## 2014-11-30 MED ORDER — GLYCOPYRROLATE 0.2 MG/ML IJ SOLN
0.2000 mg | INTRAMUSCULAR | Status: DC | PRN
  Administered 2014-11-30: 0.2 mg via INTRAVENOUS
  Filled 2014-11-30 (×2): qty 1

## 2014-11-30 MED ORDER — SODIUM CHLORIDE 0.9 % IV SOLN
INTRAVENOUS | Status: DC
Start: 2014-11-30 — End: 2014-12-01
  Administered 2014-11-30: 17:00:00 via INTRAVENOUS

## 2014-11-30 MED ORDER — FENTANYL BOLUS VIA INFUSION
100.0000 ug | INTRAVENOUS | Status: DC | PRN
  Administered 2014-11-30 (×9): 100 ug via INTRAVENOUS
  Administered 2014-11-30: 50 ug via INTRAVENOUS
  Administered 2014-11-30 (×2): 100 ug via INTRAVENOUS
  Filled 2014-11-30: qty 100

## 2014-11-30 MED ORDER — HALOPERIDOL LACTATE 5 MG/ML IJ SOLN: 0.5000 mg | INTRAMUSCULAR | Status: DC | PRN

## 2014-11-30 MED ORDER — MIDAZOLAM BOLUS VIA INFUSION
1.0000 mg | INTRAVENOUS | Status: DC | PRN
  Administered 2014-11-30 (×6): 1 mg via INTRAVENOUS
  Filled 2014-11-30 (×2): qty 1

## 2014-11-30 MED ORDER — HALOPERIDOL 1 MG PO TABS: 0.5000 mg | ORAL_TABLET | ORAL | Status: DC | PRN

## 2014-11-30 MED ORDER — MORPHINE SULFATE (PF) 4 MG/ML IV SOLN
INTRAVENOUS | Status: AC
  Filled 2014-11-30: qty 1

## 2014-11-30 MED ORDER — BISACODYL 10 MG RE SUPP: 10.0000 mg | Freq: Every day | RECTAL | Status: DC | PRN

## 2014-11-30 MED ORDER — SODIUM CHLORIDE 0.9 % IV SOLN
25.0000 ug/h | INTRAVENOUS | Status: DC
  Administered 2014-11-30: 25 ug/h via INTRAVENOUS
  Administered 2014-11-30: 300 ug/h via INTRAVENOUS
  Filled 2014-11-30 (×2): qty 50

## 2014-11-30 MED ORDER — MIDAZOLAM BOLUS VIA INFUSION
1.0000 mg | INTRAVENOUS | Status: DC | PRN
  Administered 2014-11-30: 1 mg via INTRAVENOUS
  Filled 2014-11-30: qty 1

## 2014-11-30 MED ORDER — SODIUM CHLORIDE 0.9 % IV SOLN
25.0000 mg | Freq: Four times a day (QID) | INTRAVENOUS | Status: DC
  Administered 2014-11-30 – 2014-12-01 (×2): 25 mg via INTRAVENOUS
  Filled 2014-11-30 (×6): qty 1

## 2014-11-30 MED ORDER — GLYCOPYRROLATE 0.2 MG/ML IJ SOLN
0.2000 mg | INTRAMUSCULAR | Status: DC | PRN
  Filled 2014-11-30: qty 1

## 2014-11-30 MED ORDER — MIDAZOLAM HCL 5 MG/ML IJ SOLN
2.0000 mg/h | INTRAMUSCULAR | Status: DC
  Administered 2014-11-30: 4 mg/h via INTRAVENOUS
  Administered 2014-11-30: 0.5 mg/h via INTRAVENOUS
  Filled 2014-11-30 (×2): qty 10

## 2014-11-30 NOTE — Consult Note (Signed)
Consultation Note Date: 12-12-2014   Patient Name: Alexandra Hogan  DOB: 07/18/1928  MRN: [id number]  Age / Sex: 81 y.o., female   PCP: Philemon Kingdom, MD Referring Physician: Azalia Bilis, MD  Reason for Consultation: Terminal care  Palliative Care Assessment and Plan Summary of Established Goals of Care and Medical Treatment Preferences   Clinical Assessment/Narrative: 81 yo woman with multiple chronic medical problems suffered cardiac arrest at home including CPR, intubation and antiarrythmic by EMS in the field- she was transported to [facility] and her husband arrived with her DNR paperwork. Palliative consulted for possible transition to comfort care in line with patient's wishes to allow for a natural death to occur.  Patient had poor baseline QOL per husband, recliner bound, had lots of chronic pain issues and suffered with neuropathic pain.    Goal is Full comfort- patient has previously stated she would not want to have her life prolonged in her current very poor condition.   EXTUBATE TO FULL COMFORT  Contacts/Participants in Discussion: Primary Decision Maker: husband  HCPOA: yes  Son, Daughter, Lucila Maine all at bedside along with husband  Code Status/Advance Care Planning:  DNR  Symptom Management:   Fentanyl infusion for comfort, bolus q5 minutes  Versed infusion-boluses for seizure activity  Palliative Prophylaxis: Robinul for secretions  Additional Recommendations (Limitations, Scope, Preferences):  If patient stabilizes transfer her to 6N for comfort care (hospitalist admit)  I introduced the concept of hospice care if she stabilizes following extubation  Psycho-social/Spiritual:   Support System: Good, family at bedside  Desire for further Chaplaincy support:yes  Prognosis: Actively dying- minutes-hours  Discharge Planning:  anticipate hospital death       Chief Complaint/History of Present Illness: cardiac arrest at home  Primary  Diagnoses  Present on Admission:  **None**  Palliative Review of Systems: Unable to obtain I have reviewed the medical record, interviewed the patient and family, and examined the patient. The following aspects are pertinent.  Past Medical History  Diagnosis Date  . Atrial fibrillation   . CHF (congestive heart failure)   . Diabetes mellitus without complication     borderline  . Hypertension   . Arthritis   . Anemia   . Varicose veins    Social History   Social History  . Marital Status: Divorced    Spouse Name: N/A  . Number of Children: N/A  . Years of Education: N/A   Social History Main Topics  . Smoking status: Never Smoker   . Smokeless tobacco: Never Used  . Alcohol Use: No  . Drug Use: No  . Sexual Activity: Not on file   Other Topics Concern  . Not on file   Social History Narrative   Family History  Problem Relation Age of Onset  . Heart disease Mother   . Heart disease Father   . Cancer Brother    Scheduled Meds:  Continuous Infusions: . fentaNYL infusion INTRAVENOUS    . midazolam (VERSED) infusion     PRN Meds:.fentaNYL, midazolam Medications Prior to Admission:  Prior to Admission medications   Medication Sig Start Date End Date Taking? Authorizing Provider  alendronate (FOSAMAX) 70 MG tablet Take 70 mg by mouth once a week. 10/25/14   Historical Provider, MD  apixaban (ELIQUIS) 2.5 MG TABS tablet Take 2.5 mg by mouth 2 (two) times daily.     Historical Provider, MD  aspirin 81 MG tablet Take 81 mg by mouth daily.    Historical Provider, MD  azelastine (  ASTELIN) 137 MCG/SPRAY nasal spray Place 1 spray into the nose as needed for rhinitis. Use in each nostril as directed    Historical Provider, MD  Cholecalciferol (VITAMIN D3) 1000 UNITS CAPS Take 1 capsule by mouth daily.    Historical Provider, MD  clotrimazole-betamethasone (LOTRISONE) lotion Apply 1 application topically 2 (two) times daily.    Historical Provider, MD  diazepam (VALIUM) 2  MG tablet Take 2 mg by mouth every 6 (six) hours as needed for anxiety.    Historical Provider, MD  dicyclomine (BENTYL) 10 MG/5ML syrup Take 20 mg by mouth 4 (four) times daily as needed.    Historical Provider, MD  escitalopram (LEXAPRO) 20 MG tablet Take 20 mg by mouth daily.    Historical Provider, MD  esomeprazole (NEXIUM) 40 MG capsule Take 40 mg by mouth daily before breakfast.    Historical Provider, MD  estrogens, conjugated, (PREMARIN) 0.45 MG tablet Take 0.45 mg by mouth daily. Take daily for 21 days then do not take for 7 days.    Historical Provider, MD  ferrous gluconate (FERGON) 325 MG tablet Take 325 mg by mouth daily with breakfast.    Historical Provider, MD  fluticasone (FLONASE) 50 MCG/ACT nasal spray Place 2 sprays into the nose daily.    Historical Provider, MD  glimepiride (AMARYL) 4 MG tablet Take 4 mg by mouth daily.    Historical Provider, MD  hydrALAZINE (APRESOLINE) 25 MG tablet Take 25 mg by mouth 3 (three) times daily.    Historical Provider, MD  isosorbide mononitrate (IMDUR) 30 MG 24 hr tablet Take 30 mg by mouth 3 (three) times daily. 11/10/14   Historical Provider, MD  JANUVIA 50 MG tablet Take 50 mg by mouth daily. 09/02/14   Historical Provider, MD  losartan (COZAAR) 25 MG tablet Take 25 mg by mouth daily.    Historical Provider, MD  metoprolol tartrate (LOPRESSOR) 25 MG tablet Take 12.5 mg by mouth 2 (two) times daily.  10/06/13   Historical Provider, MD  moxifloxacin (AVELOX) 400 MG tablet Take 400 mg by mouth daily. 09/22/14   Historical Provider, MD  nitroGLYCERIN (NITROSTAT) 0.4 MG SL tablet Place 0.4 mg under the tongue every 5 (five) minutes as needed for chest pain.     Historical Provider, MD  ondansetron (ZOFRAN) 8 MG tablet Take 8 mg by mouth every 8 (eight) hours as needed for nausea.    Historical Provider, MD  oxyCODONE (OXYCONTIN) 10 MG 12 hr tablet Take 10 mg by mouth every 6 (six) hours as needed for pain.    Historical Provider, MD    oxyCODONE-acetaminophen (PERCOCET) 7.5-325 MG per tablet Take 1 tablet by mouth every 6 (six) hours as needed for pain.    Historical Provider, MD  predniSONE (DELTASONE) 1 MG tablet Take 1 mg by mouth daily.    Historical Provider, MD  pregabalin (LYRICA) 25 MG capsule Take 25 mg by mouth 3 (three) times daily.    Historical Provider, MD  promethazine (PHENERGAN) 12.5 MG suppository Place 12.5 mg rectally every 4 (four) hours as needed for nausea.    Historical Provider, MD  rivaroxaban (XARELTO) 20 MG TABS tablet Take 20 mg by mouth daily.    Historical Provider, MD  spironolactone (ALDACTONE) 25 MG tablet Take 25 mg by mouth daily.    Historical Provider, MD  topiramate (TOPAMAX) 25 MG tablet Take 25 mg by mouth 2 (two) times daily.    Historical Provider, MD  torsemide (DEMADEX) 100 MG tablet Take 50  mg by mouth 2 (two) times daily.     Historical Provider, MD  traZODone (DESYREL) 100 MG tablet Take 100 mg by mouth daily.    Historical Provider, MD  triamcinolone cream (KENALOG) 0.1 % Apply 1 application topically 2 (two) times daily.    Historical Provider, MD  warfarin (COUMADIN) 1 MG tablet Take 1 mg by mouth as directed.    Historical Provider, MD   Allergies  Allergen Reactions  . Codeine     Pt states she is not real sure of the reaction but knows "it did not set well with her"  . Morphine And Related Other (See Comments)    Headache   . Amitriptyline Palpitations   CBC:    Component Value Date/Time   WBC 10.2 11/19/2008 0957   HGB 12.5 11/19/2008 0957   HCT 36.9 11/19/2008 0957   PLT 306 11/19/2008 0957   MCV 87.0 11/19/2008 0957   Comprehensive Metabolic Panel:    Component Value Date/Time   NA 137 11/19/2008 0957   K 4.2 11/19/2008 0957   CL 103 11/19/2008 0957   CO2 25 11/19/2008 0957   BUN 8 11/19/2008 0957   CREATININE 0.65 11/19/2008 0957   GLUCOSE 130* 11/19/2008 0957   CALCIUM 9.3 11/19/2008 0957    Physical Exam: Vital Signs: BP 158/53 mmHg  Pulse 44   Temp(Src) 98.8 F (37.1 C) (Rectal)  Resp 22  Ht 5\' 2"  (1.575 m)  SpO2 100% SpO2: SpO2: 100 % O2 Device: O2 Device: Ventilator O2 Flow Rate:   Intake/output summary: No intake or output data in the 24 hours ending 12/15/14 1304 LBM:   Baseline Weight:   Most recent weight:    Exam Findings:  Intubated,  No neurological responses, eyes are injected, pinpoint pupils Jerking partial seizure like activity, myoclonus No gag-copious secretions         Palliative Performance Scale: 10               Additional Data Reviewed: No results for input(s): WBC, HGB, PLT, NA, BUN, CREATININE, ALB in the last 72 hours.   Time In: 1PM   Time Out: 2:15 Time Total: 75 min Greater than 50%  of this time was spent counseling and coordinating care related to the above assessment and plan.  Signed by: Hilbert Odor, DO  15-Dec-2014, 1:04 PM  Please contact Palliative Medicine Team phone at [phone] for questions and concerns.

## 2014-11-30 NOTE — Progress Notes (Signed)
Terminal extubation done.  Patient's vitals are stable and WNL. No complications noted.

## 2014-11-30 NOTE — H&P (Signed)
History and Physical  Alexandra Hogan EXH:[id number] DOB: 1928/12/11 DOA: 11/25/2014  Referring physician: Dr. Theodosia Quay PCP: Ernestene Kiel, MD    Chief Complaint: Status post cardiac arrest  HPI: Alexandra Hogan is a 81 y.o. female with multiple medical problems including atrial fibrillation, CHF, DM 2, HTN, suffered cardiac arrest at home and was resuscitated including CPR, intubation and antiarrhythmic medications by EMS in the field and she was transported to [facility] ED on 12/12/2014. Subsequently her spouse arrived with DO NOT RESUSCITATE paperwork. Palliative care team was consulted and met with family and patient has been transitioned to full comfort care. She has been extubated. As per palliative care MDs discussion with husband, patient had poor baseline quality of life, recliner bound, had lots of chronic pain issues and suffered with neuropathy pain. Patient apparently had previously stated that she would not want to have her life prolonged in her current very poor condition. Hospitalist admission was requested.   Review of Systems: All systems reviewed and apart from history of presenting illness, are negative.  Past Medical History  Diagnosis Date  . Atrial fibrillation   . CHF (congestive heart failure)   . Diabetes mellitus without complication     borderline  . Hypertension   . Arthritis   . Anemia   . Varicose veins    Past Surgical History  Procedure Laterality Date  . Back surgery  2011  . Abdominal hysterectomy    . Tubal ligation    . Appendectomy     Social History:  reports that she has never smoked. She has never used smokeless tobacco. She reports that she does not drink alcohol or use illicit drugs.   Allergies  Allergen Reactions  . Codeine     Pt states she is not real sure of the reaction but knows "it did not set well with her"  . Morphine And Related Other (See Comments)    Headache   . Amitriptyline Palpitations    Family History  Problem  Relation Age of Onset  . Heart disease Mother   . Heart disease Father   . Cancer Brother     Prior to Admission medications   Medication Sig Start Date End Date Taking? Authorizing Provider  alendronate (FOSAMAX) 70 MG tablet Take 70 mg by mouth once a week. 10/25/14   Historical Provider, MD  apixaban (ELIQUIS) 2.5 MG TABS tablet Take 2.5 mg by mouth 2 (two) times daily.     Historical Provider, MD  aspirin 81 MG tablet Take 81 mg by mouth daily.    Historical Provider, MD  azelastine (ASTELIN) 137 MCG/SPRAY nasal spray Place 1 spray into the nose as needed for rhinitis. Use in each nostril as directed    Historical Provider, MD  Cholecalciferol (VITAMIN D3) 1000 UNITS CAPS Take 1 capsule by mouth daily.    Historical Provider, MD  clotrimazole-betamethasone (LOTRISONE) lotion Apply 1 application topically 2 (two) times daily.    Historical Provider, MD  diazepam (VALIUM) 2 MG tablet Take 2 mg by mouth every 6 (six) hours as needed for anxiety.    Historical Provider, MD  dicyclomine (BENTYL) 10 MG/5ML syrup Take 20 mg by mouth 4 (four) times daily as needed.    Historical Provider, MD  escitalopram (LEXAPRO) 20 MG tablet Take 20 mg by mouth daily.    Historical Provider, MD  esomeprazole (NEXIUM) 40 MG capsule Take 40 mg by mouth daily before breakfast.    Historical Provider, MD  estrogens,  conjugated, (PREMARIN) 0.45 MG tablet Take 0.45 mg by mouth daily. Take daily for 21 days then do not take for 7 days.    Historical Provider, MD  ferrous gluconate (FERGON) 325 MG tablet Take 325 mg by mouth daily with breakfast.    Historical Provider, MD  fluticasone (FLONASE) 50 MCG/ACT nasal spray Place 2 sprays into the nose daily.    Historical Provider, MD  glimepiride (AMARYL) 4 MG tablet Take 4 mg by mouth daily.    Historical Provider, MD  hydrALAZINE (APRESOLINE) 25 MG tablet Take 25 mg by mouth 3 (three) times daily.    Historical Provider, MD  isosorbide mononitrate (IMDUR) 30 MG 24 hr  tablet Take 30 mg by mouth 3 (three) times daily. 11/10/14   Historical Provider, MD  JANUVIA 50 MG tablet Take 50 mg by mouth daily. 09/02/14   Historical Provider, MD  losartan (COZAAR) 25 MG tablet Take 25 mg by mouth daily.    Historical Provider, MD  metoprolol tartrate (LOPRESSOR) 25 MG tablet Take 12.5 mg by mouth 2 (two) times daily.  10/06/13   Historical Provider, MD  moxifloxacin (AVELOX) 400 MG tablet Take 400 mg by mouth daily. 09/22/14   Historical Provider, MD  nitroGLYCERIN (NITROSTAT) 0.4 MG SL tablet Place 0.4 mg under the tongue every 5 (five) minutes as needed for chest pain.     Historical Provider, MD  ondansetron (ZOFRAN) 8 MG tablet Take 8 mg by mouth every 8 (eight) hours as needed for nausea.    Historical Provider, MD  oxyCODONE (OXYCONTIN) 10 MG 12 hr tablet Take 10 mg by mouth every 6 (six) hours as needed for pain.    Historical Provider, MD  oxyCODONE-acetaminophen (PERCOCET) 7.5-325 MG per tablet Take 1 tablet by mouth every 6 (six) hours as needed for pain.    Historical Provider, MD  predniSONE (DELTASONE) 1 MG tablet Take 1 mg by mouth daily.    Historical Provider, MD  pregabalin (LYRICA) 25 MG capsule Take 25 mg by mouth 3 (three) times daily.    Historical Provider, MD  promethazine (PHENERGAN) 12.5 MG suppository Place 12.5 mg rectally every 4 (four) hours as needed for nausea.    Historical Provider, MD  rivaroxaban (XARELTO) 20 MG TABS tablet Take 20 mg by mouth daily.    Historical Provider, MD  spironolactone (ALDACTONE) 25 MG tablet Take 25 mg by mouth daily.    Historical Provider, MD  topiramate (TOPAMAX) 25 MG tablet Take 25 mg by mouth 2 (two) times daily.    Historical Provider, MD  torsemide (DEMADEX) 100 MG tablet Take 50 mg by mouth 2 (two) times daily.     Historical Provider, MD  traZODone (DESYREL) 100 MG tablet Take 100 mg by mouth daily.    Historical Provider, MD  triamcinolone cream (KENALOG) 0.1 % Apply 1 application topically 2 (two) times  daily.    Historical Provider, MD  warfarin (COUMADIN) 1 MG tablet Take 1 mg by mouth as directed.    Historical Provider, MD   Physical Exam: Filed Vitals:   12/07/2014 1513 11/16/2014 1515 11/23/2014 1518 12/04/2014 1527  BP:      Pulse: 108 105 102 122  Temp: 97.9 F (36.6 C) 97.9 F (36.6 C) 97.9 F (36.6 C) 97.9 F (36.6 C)  TempSrc:      Resp: 34 35 31 24  Height:      SpO2: 65% 67% 73% 46%     General exam: Moderately built and nourished elderly female  patient, lying supine on the gurney with mild tachypnea and grunting.  Head, eyes and ENT: Not examined.  Neck: Supple. No JVD, carotid bruit or thyromegaly.  Lymphatics: Not examined.  Respiratory system: Reduced breath sounds bilaterally. Mild tachypnea and grunting. RN nursing titrating her drips.  Cardiovascular system: S1 and S2 heard, RRR. No JVD, murmurs, gallops, clicks or pedal edema.  Gastrointestinal system: Abdomen is nondistended, soft and nontender. Normal bowel sounds heard. No organomegaly or masses appreciated.  Central nervous system: Unresponsive.  Extremities: No spontaneous limb movements.  Skin: Not assessed  Musculoskeletal system: Not assessed  Psychiatry: Not assessed   Labs on Admission:  Basic Metabolic Panel:  Recent Labs Lab 12/15/2014 1105  NA 138  K 2.4*  CL 96*  CO2 26  GLUCOSE 255*  BUN 7  CREATININE 1.45*  CALCIUM 7.8*  MG 1.3*  PHOS 4.0   Liver Function Tests:  Recent Labs Lab 11/17/2014 1105  AST 147*  ALT 46  ALKPHOS 66  BILITOT 0.6  PROT 5.4*  ALBUMIN 2.3*   No results for input(s): LIPASE, AMYLASE in the last 168 hours. No results for input(s): AMMONIA in the last 168 hours. CBC:  Recent Labs Lab 12/08/2014 1105  WBC 23.8*  NEUTROABS 18.3*  HGB 11.4*  HCT 35.0*  MCV 91.6  PLT 309   Cardiac Enzymes: No results for input(s): CKTOTAL, CKMB, CKMBINDEX, TROPONINI in the last 168 hours.  BNP (last 3 results) No results for input(s): PROBNP in the last  8760 hours. CBG: No results for input(s): GLUCAP in the last 168 hours.  Radiological Exams on Admission: Ct Head Wo Contrast  11/27/2014   CLINICAL DATA:  Altered mental status.  Cardiac arrest.  EXAM: CT HEAD WITHOUT CONTRAST  TECHNIQUE: Contiguous axial images were obtained from the base of the skull through the vertex without intravenous contrast.  COMPARISON:  08/13/2014  FINDINGS: Sinuses/Soft tissues: Minimal motion degradation. Clear paranasal sinuses and mastoid air cells.  Intracranial: No mass lesion, hemorrhage, hydrocephalus, acute infarct, intra-axial, or extra-axial fluid collection.  IMPRESSION: 1. Mild motion degradation. 2. Given this factor, no acute intracranial abnormality.   Electronically Signed   By: Abigail Miyamoto M.D.   On: 11/21/2014 12:21   Dg Chest Portable 1 View  12/02/2014   CLINICAL DATA:  Post CPR. Ended trachea scratch head intubated. Trauma.  EXAM: PORTABLE CHEST - 1 VIEW  COMPARISON:  08/13/2014  FINDINGS: Endotracheal tube is 3.6 cm above the carina. Left pacer is unchanged. NG tube enters the stomach.  There is cardiomegaly. There is moderate left pleural effusion with left lower lobe atelectasis or infiltrate. No confluent opacity on the right. Mild vascular congestion.  IMPRESSION: Endotracheal tube 3.5 cm above the carina.  Moderate left pleural effusion with left lower lobe atelectasis or infiltrate.  Cardiomegaly with vascular congestion.   Electronically Signed   By: Rolm Baptise M.D.   On: 11/27/2014 11:43      Assessment/Plan Principal Problem:   Sudden cardiac arrest Active Problems:   DNR (do not resuscitate)   Anoxic brain injury   Chronic pain   Congestive heart failure   Cardiac arrest   Patient with multiple chronic severe medical problems, poor baseline quality of life, suffered outside hospital sudden cardiac arrest and was resuscitated and brought to ED where palliative care has consulted on patient and transitioned her to full comfort  care. Prognosis: Minutes to hours. Anticipate hospital death.     Code Status: DO NOT RESUSCITATE  Family Communication:  Discussed with patient's son, daughter and extended family at bedside except spouse who had stepped out of the room.  Disposition Plan: Expected hospital death.   Time spent: 30 minutes.  Vernell Leep, MD, FACP, FHM. [facility] Pager [phone]  If 7PM-7AM, please contact night-coverage [email or web address] [facility] 11/26/2014, 3:31 PM

## 2014-11-30 NOTE — ED Notes (Signed)
Accompanied pt to CT scanner

## 2014-11-30 NOTE — ED Notes (Signed)
Alexandra Mane, MD notified re: pts CPOT not improving, Phillips Odor, DO repaged re: fentanyl & versed infusions

## 2014-11-30 NOTE — ED Notes (Signed)
Pt extubated

## 2014-11-30 NOTE — Progress Notes (Signed)
   11/19/2014 1500  Clinical Encounter Type  Visited With Family;Health care provider  Visit Type Initial;Critical Care;Death;Patient actively dying  Referral From Palliative care team  Consult/Referral To Chaplain  Spiritual Encounters  Spiritual Needs Grief support;Emotional;Prayer  Ch responded to page request from Palliative team; pt placed on comfort care; grief support and prayer for family offered; ch available for additional support.

## 2014-11-30 NOTE — ED Provider Notes (Signed)
CSN: [id number]     Arrival date & time    History   First MD Initiated Contact with Patient 11/27/2014 1103     No chief complaint on file.    (Consider location/radiation/quality/duration/timing/severity/associated sxs/prior Treatment) The history is provided by the EMS personnel and medical records. The history is limited by the condition of the patient. No language interpreter was used.  Patient is an 81 year old female with past medical history of atrial fibrillation, congestive heart failure, diabetes who presents today status post cardiac arrest that occurred immediately prior to arrival. EMS relates that patient was assisting the patient at home with getting on the commode. During this time patient suddenly passed out he guided her down towards the floor and shortly after started CPR. CPR was reportedly conceive for 2 minutes by the husband and then EMS arrived and the initiated CPR. On arrival the patient was ventricular fibrillation on the monitor. The patient was subsequently shocked and then converted to PEA, at that point CPR was initiated. Patient then was seen to be in ventricular fibrillation again and was subsequently shocked him return of spontaneous circulation was obtained. Patient did notably receive 1 of epi. Additionally 300 mg amiodarone push was administered. Patient was started on an amiodarone drip 1 mg per minute for 10 minutes which was completed on arrival to our department. Patient was intubated with a 7.0 tube. Appears to be approximately 22 at the lip. Patient is notably DO NOT RESUSCITATE paperwork was obtained by EMS however husband had asked that every thing be done to get a pulse back. Patient has an 18-gauge in the right [facility] an IO in the right tib-fib.  Past Medical History  Diagnosis Date  . Atrial fibrillation   . CHF (congestive heart failure)   . Diabetes mellitus without complication     borderline  . Hypertension   . Arthritis   . Anemia   . Varicose  veins    Past Surgical History  Procedure Laterality Date  . Back surgery  2011  . Abdominal hysterectomy    . Tubal ligation    . Appendectomy     Family History  Problem Relation Age of Onset  . Heart disease Mother   . Heart disease Father   . Cancer Brother    Social History  Substance Use Topics  . Smoking status: Never Smoker   . Smokeless tobacco: Never Used  . Alcohol Use: No   OB History    No data available     Review of Systems  Unable to perform ROS: Patient unresponsive      Allergies  Codeine; Morphine and related; and Amitriptyline  Home Medications   Prior to Admission medications   Medication Sig Start Date End Date Taking? Authorizing Provider  alendronate (FOSAMAX) 70 MG tablet Take 70 mg by mouth once a week. 10/25/14   Historical Provider, MD  apixaban (ELIQUIS) 2.5 MG TABS tablet Take 2.5 mg by mouth 2 (two) times daily.     Historical Provider, MD  aspirin 81 MG tablet Take 81 mg by mouth daily.    Historical Provider, MD  azelastine (ASTELIN) 137 MCG/SPRAY nasal spray Place 1 spray into the nose as needed for rhinitis. Use in each nostril as directed    Historical Provider, MD  Cholecalciferol (VITAMIN D3) 1000 UNITS CAPS Take 1 capsule by mouth daily.    Historical Provider, MD  clotrimazole-betamethasone (LOTRISONE) lotion Apply 1 application topically 2 (two) times daily.    Historical Provider,  MD  diazepam (VALIUM) 2 MG tablet Take 2 mg by mouth every 6 (six) hours as needed for anxiety.    Historical Provider, MD  dicyclomine (BENTYL) 10 MG/5ML syrup Take 20 mg by mouth 4 (four) times daily as needed.    Historical Provider, MD  escitalopram (LEXAPRO) 20 MG tablet Take 20 mg by mouth daily.    Historical Provider, MD  esomeprazole (NEXIUM) 40 MG capsule Take 40 mg by mouth daily before breakfast.    Historical Provider, MD  estrogens, conjugated, (PREMARIN) 0.45 MG tablet Take 0.45 mg by mouth daily. Take daily for 21 days then do not take  for 7 days.    Historical Provider, MD  ferrous gluconate (FERGON) 325 MG tablet Take 325 mg by mouth daily with breakfast.    Historical Provider, MD  fluticasone (FLONASE) 50 MCG/ACT nasal spray Place 2 sprays into the nose daily.    Historical Provider, MD  glimepiride (AMARYL) 4 MG tablet Take 4 mg by mouth daily.    Historical Provider, MD  hydrALAZINE (APRESOLINE) 25 MG tablet Take 25 mg by mouth 3 (three) times daily.    Historical Provider, MD  isosorbide mononitrate (IMDUR) 30 MG 24 hr tablet Take 30 mg by mouth 3 (three) times daily. 11/10/14   Historical Provider, MD  JANUVIA 50 MG tablet Take 50 mg by mouth daily. 09/02/14   Historical Provider, MD  losartan (COZAAR) 25 MG tablet Take 25 mg by mouth daily.    Historical Provider, MD  metoprolol tartrate (LOPRESSOR) 25 MG tablet Take 12.5 mg by mouth 2 (two) times daily.  10/06/13   Historical Provider, MD  moxifloxacin (AVELOX) 400 MG tablet Take 400 mg by mouth daily. 09/22/14   Historical Provider, MD  nitroGLYCERIN (NITROSTAT) 0.4 MG SL tablet Place 0.4 mg under the tongue every 5 (five) minutes as needed for chest pain.     Historical Provider, MD  ondansetron (ZOFRAN) 8 MG tablet Take 8 mg by mouth every 8 (eight) hours as needed for nausea.    Historical Provider, MD  oxyCODONE (OXYCONTIN) 10 MG 12 hr tablet Take 10 mg by mouth every 6 (six) hours as needed for pain.    Historical Provider, MD  oxyCODONE-acetaminophen (PERCOCET) 7.5-325 MG per tablet Take 1 tablet by mouth every 6 (six) hours as needed for pain.    Historical Provider, MD  predniSONE (DELTASONE) 1 MG tablet Take 1 mg by mouth daily.    Historical Provider, MD  pregabalin (LYRICA) 25 MG capsule Take 25 mg by mouth 3 (three) times daily.    Historical Provider, MD  promethazine (PHENERGAN) 12.5 MG suppository Place 12.5 mg rectally every 4 (four) hours as needed for nausea.    Historical Provider, MD  rivaroxaban (XARELTO) 20 MG TABS tablet Take 20 mg by mouth daily.     Historical Provider, MD  spironolactone (ALDACTONE) 25 MG tablet Take 25 mg by mouth daily.    Historical Provider, MD  topiramate (TOPAMAX) 25 MG tablet Take 25 mg by mouth 2 (two) times daily.    Historical Provider, MD  torsemide (DEMADEX) 100 MG tablet Take 50 mg by mouth 2 (two) times daily.     Historical Provider, MD  traZODone (DESYREL) 100 MG tablet Take 100 mg by mouth daily.    Historical Provider, MD  triamcinolone cream (KENALOG) 0.1 % Apply 1 application topically 2 (two) times daily.    Historical Provider, MD  warfarin (COUMADIN) 1 MG tablet Take 1 mg by mouth as  directed.    Historical Provider, MD   BP 106/65 mmHg  Pulse 125  Temp(Src) 97.7 F (36.5 C) (Rectal)  Resp 30  Ht 5\' 2"  (1.575 m)  SpO2 52% Physical Exam  Constitutional: She has a sickly appearance. She is intubated. Backboard in place.  HENT:  Head: Normocephalic and atraumatic.  Eyes: Conjunctivae are normal. Pupils are equal, round, and reactive to light.  Neck: Neck supple.  Cardiovascular: An irregularly irregular rhythm present.  No murmur heard. Pulmonary/Chest: No stridor. She is intubated. She is in respiratory distress.  Abdominal: Soft. She exhibits no distension.  Neurological: She is unresponsive. GCS eye subscore is 1. GCS verbal subscore is 1. GCS motor subscore is 1.  Skin: Skin is warm and dry.  Nursing note and vitals reviewed.   ED Course  Procedures (including critical care time) Labs Review Labs Reviewed  CBC WITH DIFFERENTIAL/PLATELET - Abnormal; Notable for the following:    WBC 23.8 (*)    RBC 3.82 (*)    Hemoglobin 11.4 (*)    HCT 35.0 (*)    Neutro Abs 18.3 (*)    All other components within normal limits  COMPREHENSIVE METABOLIC PANEL - Abnormal; Notable for the following:    Potassium 2.4 (*)    Chloride 96 (*)    Glucose, Bld 255 (*)    Creatinine, Ser 1.45 (*)    Calcium 7.8 (*)    Total Protein 5.4 (*)    Albumin 2.3 (*)    AST 147 (*)    GFR calc non Af Amer  32 (*)    GFR calc Af Amer 37 (*)    Anion gap 16 (*)    All other components within normal limits  MAGNESIUM - Abnormal; Notable for the following:    Magnesium 1.3 (*)    All other components within normal limits  I-STAT CG4 LACTIC ACID, ED - Abnormal; Notable for the following:    Lactic Acid, Venous 7.47 (*)    All other components within normal limits  I-STAT ARTERIAL BLOOD GAS, ED - Abnormal; Notable for the following:    pCO2 arterial 48.6 (*)    pO2, Arterial 317.0 (*)    Bicarbonate 32.0 (*)    Acid-Base Excess 7.0 (*)    All other components within normal limits  I-STAT TROPOININ, ED - Abnormal; Notable for the following:    Troponin i, poc 0.09 (*)    All other components within normal limits  PHOSPHORUS  CDS SEROLOGY    Imaging Review Ct Head Wo Contrast  12/11/2014   CLINICAL DATA:  Altered mental status.  Cardiac arrest.  EXAM: CT HEAD WITHOUT CONTRAST  TECHNIQUE: Contiguous axial images were obtained from the base of the skull through the vertex without intravenous contrast.  COMPARISON:  08/13/2014  FINDINGS: Sinuses/Soft tissues: Minimal motion degradation. Clear paranasal sinuses and mastoid air cells.  Intracranial: No mass lesion, hemorrhage, hydrocephalus, acute infarct, intra-axial, or extra-axial fluid collection.  IMPRESSION: 1. Mild motion degradation. 2. Given this factor, no acute intracranial abnormality.   Electronically Signed   By: Jeronimo Greaves M.D.   On: 12/14/2014 12:21   Dg Chest Portable 1 View  11/18/2014   CLINICAL DATA:  Post CPR. Ended trachea scratch head intubated. Trauma.  EXAM: PORTABLE CHEST - 1 VIEW  COMPARISON:  08/13/2014  FINDINGS: Endotracheal tube is 3.6 cm above the carina. Left pacer is unchanged. NG tube enters the stomach.  There is cardiomegaly. There is moderate left pleural effusion with  left lower lobe atelectasis or infiltrate. No confluent opacity on the right. Mild vascular congestion.  IMPRESSION: Endotracheal tube 3.5 cm above  the carina.  Moderate left pleural effusion with left lower lobe atelectasis or infiltrate.  Cardiomegaly with vascular congestion.   Electronically Signed   By: Charlett Nose M.D.   On: 11/24/2014 11:43   I have personally reviewed and evaluated these images and lab results as part of my medical decision-making.   EKG Interpretation None      MDM   Final diagnoses:  Cardiac arrest  Endotracheally intubated  Hypokalemia  History of CHF (congestive heart failure)    Patient is an 81 year old female with past medical history of atrial fibrillation, congestive heart failure, diabetes who presents today status post cardiac arrest that occurred immediately prior to arrival. Patient was in critical condition on arrival. She was intubated by EMS prior to arrival. Discussed with family on their arrival and at this point they prefer palliative care. Palliative care consult was ordered and upon their evaluation the planned to compression of the extubate the patient. Palliative care relates that they believe patient may continue to be in critical condition but would benefit from hospitalist admission for further palliative care. Family agreeable with this plan.    Madolyn Frieze, MD 11/29/2014 1554  Azalia Bilis, MD 12/02/2014 1638  Azalia Bilis, MD 11/29/2014 [phone]

## 2014-11-30 NOTE — ED Notes (Signed)
Pt in from home via [facility], per report pt had witnessed arrest while on the toilet, pt was lowered to the floor & CPR was started by husband for about 2 mins prior to EMS arrival, pt found in v fib upon ems arrival, pt shocked & remained in PEA, CPR was continued, pt remained in v fib, pt shocked again with ROSC, total code time estimated 9 mins, pt rcvd x 1 epi, Amiodarone push 300 mg administered, & Amiodarone drip /1 min for 10 mins that was completed upon arrival, pt has ET tube 7.0 secured at 20 at the lip, EMS presented with DNR paperwork post ROSC, pt arrives to ED with yellow DNR paper, # 18 in R AC, & IO R tib/fib

## 2014-11-30 NOTE — ED Notes (Signed)
Admitting MD paged to inform that pts son is at bedside

## 2014-11-30 NOTE — Progress Notes (Signed)
Chaplain responded to ED post CPR of a 81 year female.  The patient was unresponsive at time of arrival to the ED.  Chaplain escorted the patient's family to the consult room for a medical status update . They were informed that the patient was stable at this time but it still critical in nature. The patient has an DNR status which was unknown during transport of patient to ED, the family wants to honor her request, but awaits outcome of other medical test before decision is finalized.   Chaplain will continue to follow for emotional support. Chaplain Janell Quiet [phone]

## 2014-11-30 NOTE — ED Notes (Signed)
[place], MD at bedside 

## 2014-11-30 NOTE — Progress Notes (Signed)
Chaplain responded to referral from daytime chaplain. Pt going through admission process at this time so, chaplain kept visit very brief. Chaplain introduced herself and her services to pt family. Offered to keep family in prayer; gesture appreciated. Family seems calm and supportive of one another. Page chaplain as needed.    12/10/2014 1800  Clinical Encounter Type  Visited With Family  Visit Type Follow-up;Spiritual support  Referral From [facility]  Spiritual Encounters  Spiritual Needs Emotional;Prayer  Jiles Harold 10-Dec-2014 6:12 PM

## 2014-11-30 NOTE — ED Notes (Signed)
Phillips Odor, DO paged to my phone re: pts CPOT score & dyspnea

## 2014-12-01 DIAGNOSIS — I469 Cardiac arrest, cause unspecified: Secondary | ICD-10-CM | POA: Diagnosis not present

## 2014-12-02 LAB — CULTURE, BLOOD (ROUTINE X 2)

## 2014-12-05 LAB — CULTURE, BLOOD (ROUTINE X 2): Culture: NO GROWTH

## 2014-12-16 NOTE — Progress Notes (Signed)
Patient expired at 1; verified with Leonie Man RN. Paged K.Kirby NP(on call) verified; [facility] was called. Pt's son Annamarie Dawley was at bedside.

## 2014-12-16 NOTE — Discharge Summary (Signed)
Death Summary  Alexandra Hogan IRC:[id number] DOB: 01/09/1929 DOA: 2014/12/26  PCP: Ernestene Kiel, MD PCP/Office notified: Will forward death summary  Admit date: Dec 26, 2014 Date of Death: 12-27-14 at 2:45 AM.   Final Diagnoses:  Principal Problem:   Sudden cardiac arrest Active Problems:   DNR (do not resuscitate)   Anoxic brain injury   Chronic pain   Congestive heart failure   Cardiac arrest   Pressure ulcer   Restlessness and agitation   Drug tolerance   Spinal stenosis    History of present illness & Hospital course:  81 y.o. female with multiple medical problems including atrial fibrillation, CHF, DM 2, HTN, suffered cardiac arrest at home and was resuscitated including CPR, intubation and antiarrhythmic medications by EMS in the field and she was transported to [facility] ED on 26-Dec-2014. Subsequently her spouse arrived with DO NOT RESUSCITATE paperwork. Palliative care team was consulted and met with family and patient was transitioned to full comfort care. She was extubated. As per palliative care MDs discussion with husband, patient had poor baseline quality of life, recliner bound, had lots of chronic pain issues and suffered with neuropathy pain. Patient apparently had previously stated that she would not want to have her life prolonged in her current very poor condition. Hospitalist admission was requested. She was placed on comfort measures and eventually demised a few hours later.   Time: 15 minutes.  SignedVernell Leep  [facility] 12/08/2014, 6:20 PM

## 2014-12-16 NOTE — Progress Notes (Signed)
Approximately 53 ml of fentanyl and about 33 ml of versed is wasted in the sink verified with Naida Sleight.

## 2014-12-16 DEATH — deceased

## 2015-01-03 ENCOUNTER — Ambulatory Visit: Payer: Medicare Other | Admitting: Podiatry

## 2015-08-29 IMAGING — CT CT HEAD W/O CM
2 series · 16 of 30 positions shown, 18 images · non-contrast
Comparison: 08/13/2014

CLINICAL DATA: Altered mental status.  Cardiac arrest.

EXAM:
CT HEAD WITHOUT CONTRAST
TECHNIQUE: Contiguous axial images were obtained from the base of the skull
through the vertex without intravenous contrast.

[Series 201: head w/o, idose (1) · axial · non-contrast · 0.40mm/px · z∈[+37,+157]mm · 8 of 32 slices shown, 10 images]
[im 4/32  brain]
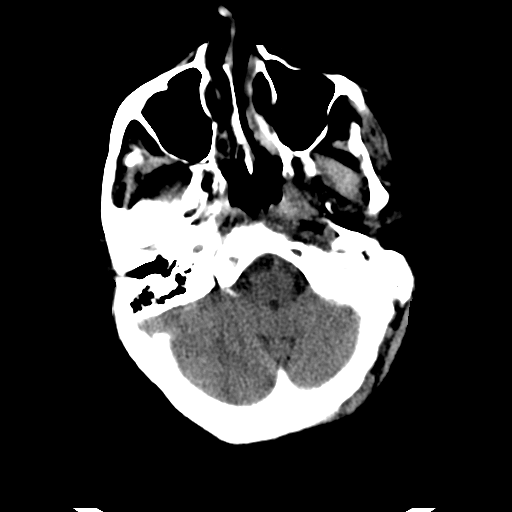
[im 4/32  bone]
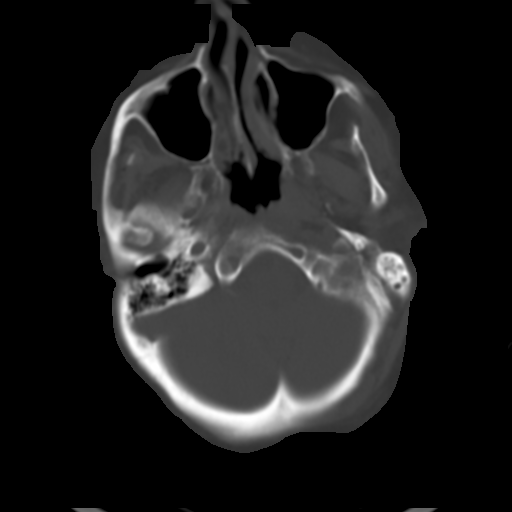
[im 7/32  brain]
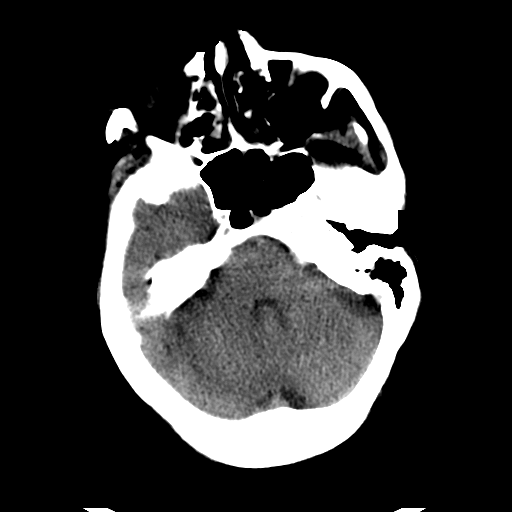
[im 11/32  brain]
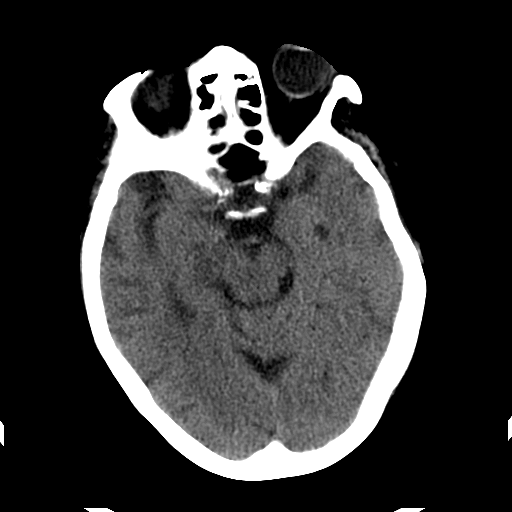
[im 14/32  brain]
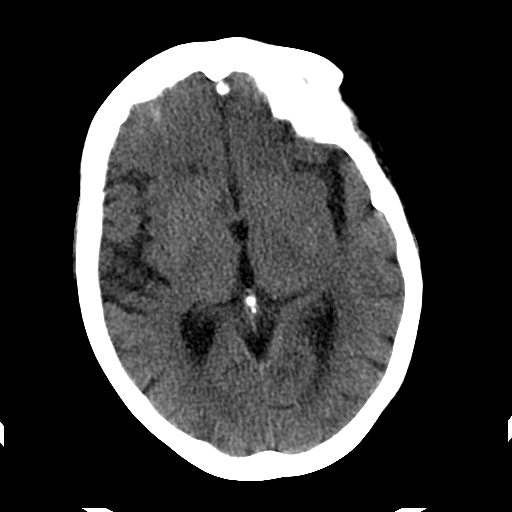
[im 18/32  brain]
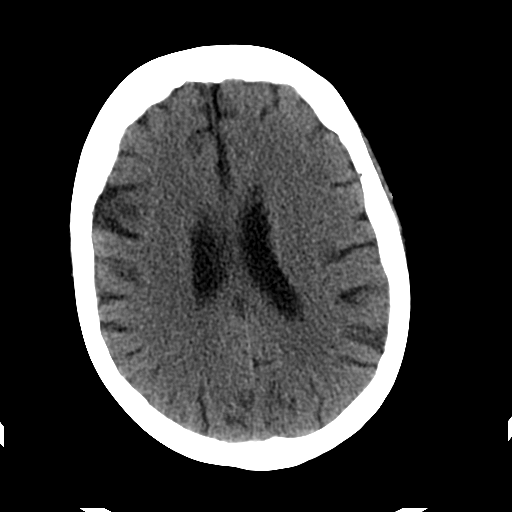
[im 18/32  bone]
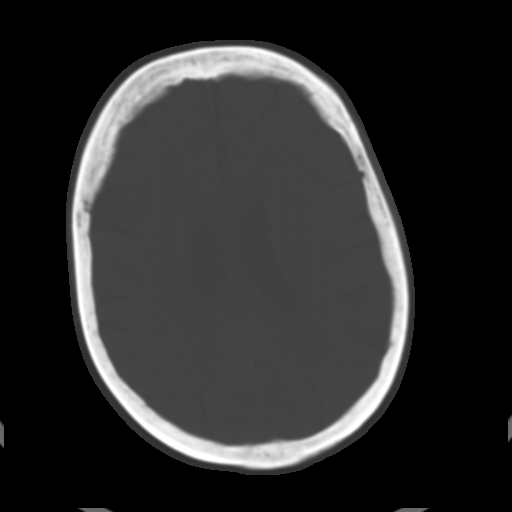
[im 21/32  brain]
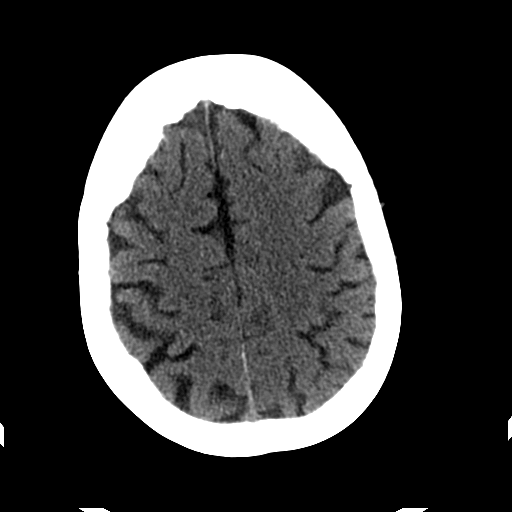
[im 25/32  brain]
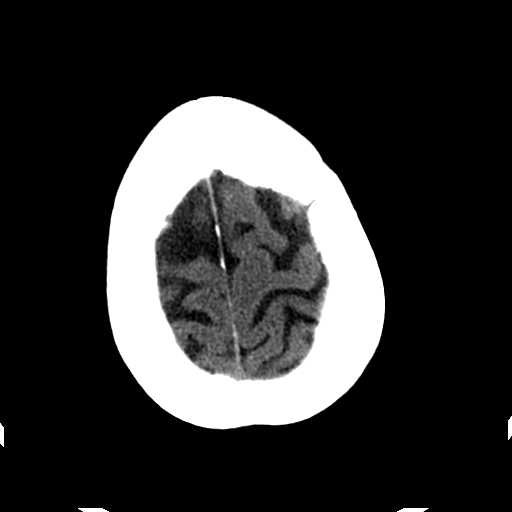
[im 28/32  brain]
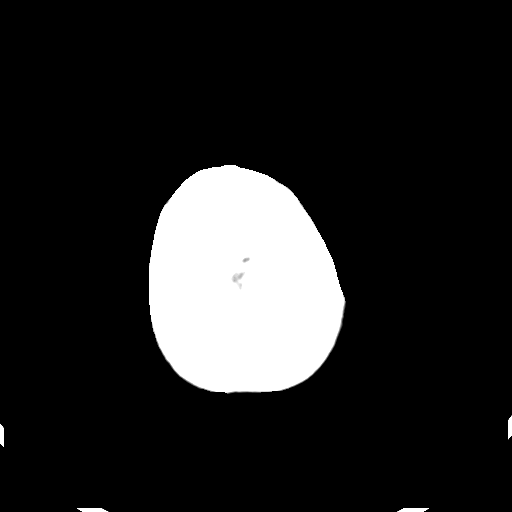

[Series 202: head w/o bone, idose (1) · axial · non-contrast · 0.40mm/px · z∈[+36,+154]mm · 8 of 64 slices shown]
[im 7/64  bone]
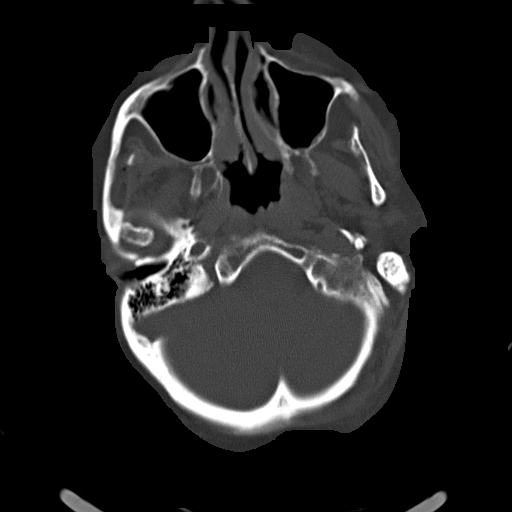
[im 14/64  bone]
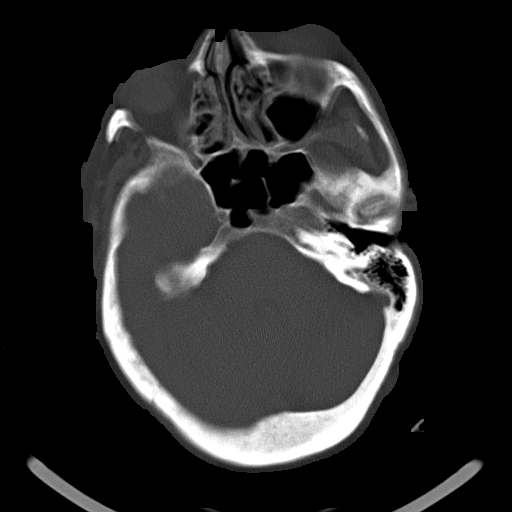
[im 20/64  bone]
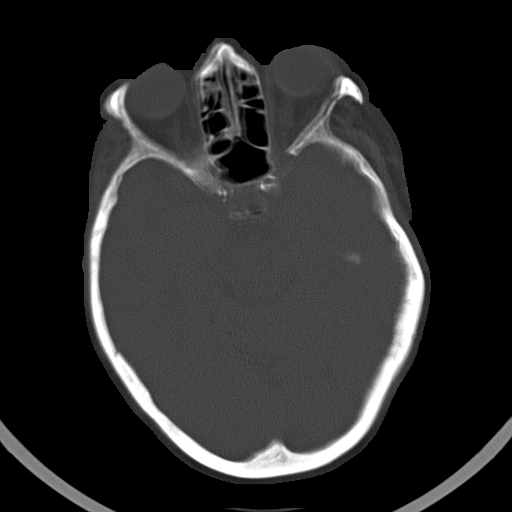
[im 27/64  bone]
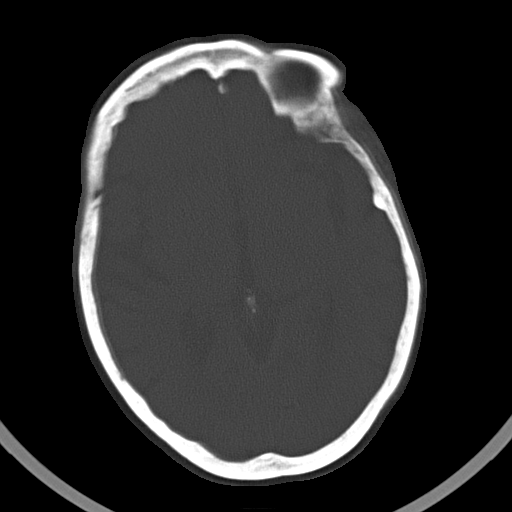
[im 34/64  bone]
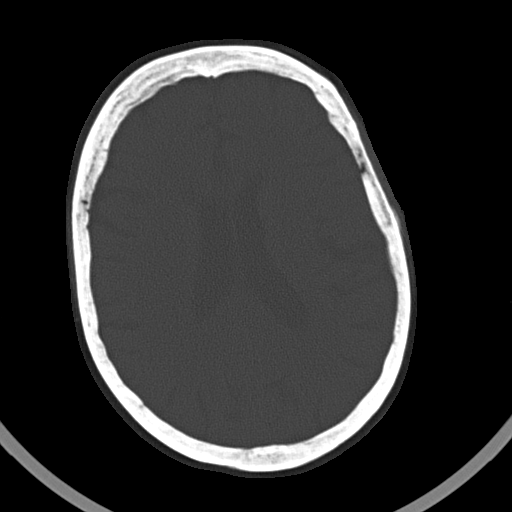
[im 40/64  bone]
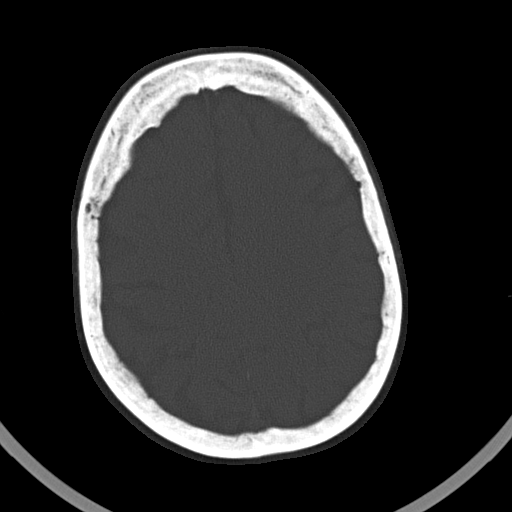
[im 47/64  bone]
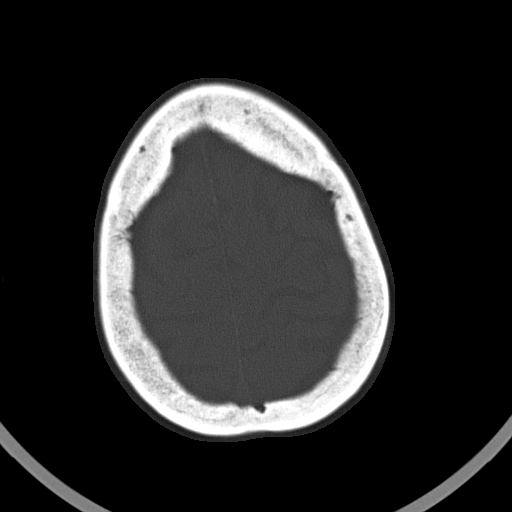
[im 54/64  bone]
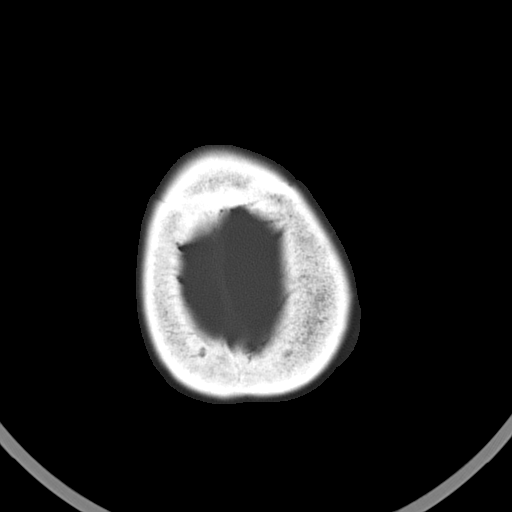

[16 of 30 positions shown; findings below may reference images not displayed]

FINDINGS: Sinuses/Soft tissues: Minimal motion degradation. Clear paranasal
sinuses and mastoid air cells.

Intracranial: No mass lesion, hemorrhage, hydrocephalus, acute
infarct, intra-axial, or extra-axial fluid collection.
IMPRESSION: 1. Mild motion degradation.
2. Given this factor, no acute intracranial abnormality.
